# Patient Record
Sex: Male | Born: 1965 | Race: White | Hispanic: No | Marital: Single | Smoking: Former smoker
Health system: Southern US, Community
[De-identification: ages and names within clinical notes are randomized; demographics above are authoritative.]

## PROBLEM LIST (undated history)

## (undated) DIAGNOSIS — J189 Pneumonia, unspecified organism: Secondary | ICD-10-CM

---
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
## 2002-03-30 ENCOUNTER — Emergency Department (HOSPITAL_COMMUNITY): Admission: EM | Admit: 2002-03-30 | Discharge: 2002-03-30 | Payer: Self-pay | Admitting: Emergency Medicine

## 2003-04-25 ENCOUNTER — Emergency Department (HOSPITAL_COMMUNITY): Admission: EM | Admit: 2003-04-25 | Discharge: 2003-04-25 | Payer: Self-pay | Admitting: Emergency Medicine

## 2004-02-12 ENCOUNTER — Emergency Department (HOSPITAL_COMMUNITY): Admission: EM | Admit: 2004-02-12 | Discharge: 2004-02-12 | Payer: Self-pay | Admitting: Emergency Medicine

## 2004-05-06 ENCOUNTER — Emergency Department (HOSPITAL_COMMUNITY): Admission: EM | Admit: 2004-05-06 | Discharge: 2004-05-06 | Payer: Self-pay | Admitting: Emergency Medicine

## 2004-07-24 ENCOUNTER — Emergency Department (HOSPITAL_COMMUNITY): Admission: EM | Admit: 2004-07-24 | Discharge: 2004-07-24 | Payer: Self-pay | Admitting: Emergency Medicine

## 2004-11-23 ENCOUNTER — Emergency Department (HOSPITAL_COMMUNITY): Admission: EM | Admit: 2004-11-23 | Discharge: 2004-11-23 | Payer: Self-pay | Admitting: *Deleted

## 2004-12-14 ENCOUNTER — Emergency Department (HOSPITAL_COMMUNITY): Admission: EM | Admit: 2004-12-14 | Discharge: 2004-12-14 | Payer: Self-pay | Admitting: *Deleted

## 2010-08-20 ENCOUNTER — Emergency Department (HOSPITAL_BASED_OUTPATIENT_CLINIC_OR_DEPARTMENT_OTHER)
Admission: EM | Admit: 2010-08-20 | Discharge: 2010-08-20 | Disposition: A | Discharge status: Home / Self Care | Payer: Self-pay | Attending: Emergency Medicine | Admitting: Emergency Medicine

## 2010-08-20 DIAGNOSIS — IMO0002 Reserved for concepts with insufficient information to code with codable children: Secondary | ICD-10-CM | POA: Insufficient documentation

## 2010-08-20 DIAGNOSIS — F17200 Nicotine dependence, unspecified, uncomplicated: Secondary | ICD-10-CM | POA: Insufficient documentation

## 2011-01-19 ENCOUNTER — Encounter: Payer: Self-pay | Admitting: *Deleted

## 2011-01-19 ENCOUNTER — Emergency Department (HOSPITAL_BASED_OUTPATIENT_CLINIC_OR_DEPARTMENT_OTHER)
Admission: EM | Admit: 2011-01-19 | Discharge: 2011-01-19 | Disposition: A | Discharge status: Home / Self Care | Payer: Self-pay | Attending: Emergency Medicine | Admitting: Emergency Medicine

## 2011-01-19 ENCOUNTER — Emergency Department (INDEPENDENT_AMBULATORY_CARE_PROVIDER_SITE_OTHER): Payer: Self-pay

## 2011-01-19 DIAGNOSIS — J329 Chronic sinusitis, unspecified: Secondary | ICD-10-CM | POA: Insufficient documentation

## 2011-01-19 DIAGNOSIS — R51 Headache: Secondary | ICD-10-CM

## 2011-01-19 DIAGNOSIS — F17200 Nicotine dependence, unspecified, uncomplicated: Secondary | ICD-10-CM | POA: Insufficient documentation

## 2011-01-19 DIAGNOSIS — J320 Chronic maxillary sinusitis: Secondary | ICD-10-CM

## 2011-01-19 MED ORDER — AMOXICILLIN 500 MG PO CAPS: 500.0000 mg | ORAL_CAPSULE | Freq: Three times a day (TID) | ORAL | Status: AC

## 2011-01-19 NOTE — ED Provider Notes (Signed)
History     CSN: [id number] Arrival date & time: 01/19/2011  5:54 PM  Chief Complaint  Patient presents with  . Headache    (Consider location/radiation/quality/duration/timing/severity/associated sxs/prior treatment) HPI Comments: Pt states that he has a cold over the last couple of weeks and the symptoms have gotten worse over the last month:pt states that when he has the symptoms if he is driving he has to pull over the side of the road:pt states that the symptoms resolve on there own after about 1 hour:pt states that he doesn't have blurred vision, but will get diaphoretic when the episodes happen  Patient is a 45 y.o. male presenting with headaches. The history is provided by the patient. No language interpreter was used.  Headache  This is a recurrent problem. The current episode started more than 1 week ago. Episode frequency: at least once a day over that last 5 months. The problem has not changed since onset.The pain is located in the right unilateral region. The quality of the pain is described as throbbing. The pain is severe. The pain does not radiate. Pertinent negatives include no fever, no malaise/fatigue, no palpitations and no shortness of breath. He has tried aspirin for the symptoms. The treatment provided mild relief.    History reviewed. No pertinent past medical history.  History reviewed. No pertinent past surgical history.  No family history on file.  History  Substance Use Topics  . Smoking status: Current Everyday Smoker -- 1.0 packs/day  . Smokeless tobacco: Not on file  . Alcohol Use: No      Review of Systems  Constitutional: Negative for fever and malaise/fatigue.  Respiratory: Negative for shortness of breath.   Cardiovascular: Negative for palpitations.  Neurological: Positive for headaches.  All other systems reviewed and are negative.    Allergies  Review of patient's allergies indicates no known allergies.  Home Medications  No current  outpatient prescriptions on file.  BP 132/75  Pulse 82  Temp(Src) 97.7 F (36.5 C) (Oral)  Resp 20  SpO2 100%  Physical Exam  Nursing note and vitals reviewed. Constitutional: He is oriented to person, place, and time. He appears well-developed and well-nourished.  HENT:  Head: Normocephalic and atraumatic.  Right Ear: External ear normal.  Left Ear: External ear normal.  Eyes: Pupils are equal, round, and reactive to light.  Neck: Neck supple.  Cardiovascular: Normal rate and regular rhythm.   Pulmonary/Chest: Effort normal and breath sounds normal.  Musculoskeletal: Normal range of motion.  Neurological: He is alert and oriented to person, place, and time.  Skin: Skin is warm and dry.    ED Course  Procedures (including critical care time)  Labs Reviewed - No data to display Ct Head Wo Contrast  01/19/2011  *RADIOLOGY REPORT*  Clinical Data: Headache, pain over right eye  CT HEAD WITHOUT CONTRAST  Technique:  Contiguous axial images were obtained from the base of the skull through the vertex without contrast.  Comparison: None.  Findings: No acute intracranial hemorrhage.  No focal mass lesion. No CT evidence of acute infarction.  No midline shift or mass effect and no hydrocephalus.  Basilar cisterns patent.  Mastoid air cells are clear.  Mild mucosal thickening within the maxillary sinuses.  IMPRESSION:  1.  No acute intracranial findings. 2.  Mild maxillary sinusitis.  Original Report Authenticated By: Genevive Bi, M.D.     No diagnosis found.    MDM  Will treat pt for sinusitis  Teressa Lower, NP 01/19/11 [phone]

## 2011-01-19 NOTE — ED Notes (Signed)
Cough sore throat and headache. States he thinks he has a sinus infection.

## 2011-01-19 NOTE — ED Notes (Signed)
Pt. Reports his pain a 7/10  No distress noted in pt.   Able to walk steady gait and no dizziness per Pt.

## 2011-01-22 NOTE — ED Provider Notes (Signed)
Medical screening examination/treatment/procedure(s) were performed by non-physician practitioner and as supervising physician I was immediately available for consultation/collaboration.  Cyndra Numbers, MD 01/22/11 [phone]

## 2011-06-13 ENCOUNTER — Emergency Department (HOSPITAL_BASED_OUTPATIENT_CLINIC_OR_DEPARTMENT_OTHER): Payer: Self-pay

## 2011-06-13 ENCOUNTER — Other Ambulatory Visit: Payer: Self-pay

## 2011-06-13 ENCOUNTER — Inpatient Hospital Stay (HOSPITAL_BASED_OUTPATIENT_CLINIC_OR_DEPARTMENT_OTHER)
Admission: EM | Admit: 2011-06-13 | Discharge: 2011-06-17 | DRG: 871 | Disposition: A | Discharge status: Home / Self Care | Payer: Self-pay | Source: Ambulatory Visit | Attending: Internal Medicine | Admitting: Internal Medicine

## 2011-06-13 ENCOUNTER — Emergency Department (INDEPENDENT_AMBULATORY_CARE_PROVIDER_SITE_OTHER): Payer: Self-pay

## 2011-06-13 ENCOUNTER — Encounter (HOSPITAL_BASED_OUTPATIENT_CLINIC_OR_DEPARTMENT_OTHER): Payer: Self-pay | Admitting: *Deleted

## 2011-06-13 DIAGNOSIS — Z681 Body mass index (BMI) 19 or less, adult: Secondary | ICD-10-CM

## 2011-06-13 DIAGNOSIS — N170 Acute kidney failure with tubular necrosis: Secondary | ICD-10-CM | POA: Diagnosis not present

## 2011-06-13 DIAGNOSIS — R071 Chest pain on breathing: Secondary | ICD-10-CM | POA: Diagnosis present

## 2011-06-13 DIAGNOSIS — E8779 Other fluid overload: Secondary | ICD-10-CM | POA: Diagnosis present

## 2011-06-13 DIAGNOSIS — R079 Chest pain, unspecified: Secondary | ICD-10-CM

## 2011-06-13 DIAGNOSIS — F17200 Nicotine dependence, unspecified, uncomplicated: Secondary | ICD-10-CM | POA: Diagnosis present

## 2011-06-13 DIAGNOSIS — R0602 Shortness of breath: Secondary | ICD-10-CM

## 2011-06-13 DIAGNOSIS — R000 Tachycardia, unspecified: Secondary | ICD-10-CM | POA: Diagnosis present

## 2011-06-13 DIAGNOSIS — D72829 Elevated white blood cell count, unspecified: Secondary | ICD-10-CM | POA: Diagnosis present

## 2011-06-13 DIAGNOSIS — Z87892 Personal history of anaphylaxis: Secondary | ICD-10-CM

## 2011-06-13 DIAGNOSIS — A419 Sepsis, unspecified organism: Principal | ICD-10-CM | POA: Diagnosis present

## 2011-06-13 DIAGNOSIS — Z91038 Other insect allergy status: Secondary | ICD-10-CM

## 2011-06-13 DIAGNOSIS — Z79899 Other long term (current) drug therapy: Secondary | ICD-10-CM

## 2011-06-13 DIAGNOSIS — R918 Other nonspecific abnormal finding of lung field: Secondary | ICD-10-CM

## 2011-06-13 DIAGNOSIS — N289 Disorder of kidney and ureter, unspecified: Secondary | ICD-10-CM | POA: Diagnosis present

## 2011-06-13 DIAGNOSIS — R799 Abnormal finding of blood chemistry, unspecified: Secondary | ICD-10-CM

## 2011-06-13 DIAGNOSIS — I959 Hypotension, unspecified: Secondary | ICD-10-CM | POA: Diagnosis present

## 2011-06-13 DIAGNOSIS — T50995A Adverse effect of other drugs, medicaments and biological substances, initial encounter: Secondary | ICD-10-CM | POA: Diagnosis not present

## 2011-06-13 DIAGNOSIS — J189 Pneumonia, unspecified organism: Secondary | ICD-10-CM | POA: Diagnosis present

## 2011-06-13 DIAGNOSIS — J90 Pleural effusion, not elsewhere classified: Secondary | ICD-10-CM | POA: Diagnosis present

## 2011-06-13 DIAGNOSIS — E871 Hypo-osmolality and hyponatremia: Secondary | ICD-10-CM | POA: Diagnosis present

## 2011-06-13 DIAGNOSIS — E876 Hypokalemia: Secondary | ICD-10-CM | POA: Diagnosis present

## 2011-06-13 DIAGNOSIS — Z23 Encounter for immunization: Secondary | ICD-10-CM

## 2011-06-13 LAB — CBC
HCT: 38.9 % — ABNORMAL LOW (ref 39.0–52.0)
HCT: 34.8 % — ABNORMAL LOW (ref 39.0–52.0)
MCH: 32.3 pg (ref 26.0–34.0)
MCV: 89 fL (ref 78.0–100.0)
MCV: 89.9 fL (ref 78.0–100.0)
Platelets: 185 10*3/uL (ref 150–400)
RBC: 4.37 MIL/uL (ref 4.22–5.81)
RDW: 14 % (ref 11.5–15.5)
RDW: 14.1 % (ref 11.5–15.5)
WBC: 27.5 10*3/uL — ABNORMAL HIGH (ref 4.0–10.5)

## 2011-06-13 LAB — BASIC METABOLIC PANEL
BUN: 21 mg/dL (ref 6–23)
Chloride: 96 mEq/L (ref 96–112)
GFR calc Af Amer: 83 mL/min — ABNORMAL LOW (ref >90)
GFR calc non Af Amer: 72 mL/min — ABNORMAL LOW (ref >90)
Glucose, Bld: 144 mg/dL — ABNORMAL HIGH (ref 70–99)
Potassium: 4.3 mEq/L (ref 3.5–5.1)
Sodium: 132 mEq/L — ABNORMAL LOW (ref 135–145)

## 2011-06-13 LAB — CARDIAC PANEL(CRET KIN+CKTOT+MB+TROPI)
CK, MB: 1.4 ng/mL (ref 0.3–4.0)
Relative Index: INVALID (ref 0.0–2.5)
Troponin I: <0.3 ng/mL (ref <0.30)

## 2011-06-13 MED ORDER — SODIUM CHLORIDE 0.9 % IV SOLN
INTRAVENOUS | Status: DC
  Administered 2011-06-13: 125 mL via INTRAVENOUS
  Administered 2011-06-14 – 2011-06-15 (×2): via INTRAVENOUS

## 2011-06-13 MED ORDER — IPRATROPIUM BROMIDE 0.02 % IN SOLN
0.5000 mg | Freq: Four times a day (QID) | RESPIRATORY_TRACT | Status: DC
  Administered 2011-06-13 – 2011-06-17 (×12): 0.5 mg via RESPIRATORY_TRACT
  Filled 2011-06-13 (×13): qty 2.5

## 2011-06-13 MED ORDER — DEXTROSE 5 % IV SOLN
1.0000 g | INTRAVENOUS | Status: DC
  Administered 2011-06-14 – 2011-06-16 (×3): 1 g via INTRAVENOUS
  Filled 2011-06-13 (×5): qty 10

## 2011-06-13 MED ORDER — PNEUMOCOCCAL VAC POLYVALENT 25 MCG/0.5ML IJ INJ
0.5000 mL | INJECTION | INTRAMUSCULAR | Status: AC
  Filled 2011-06-13: qty 0.5

## 2011-06-13 MED ORDER — AZITHROMYCIN 250 MG PO TABS
500.0000 mg | ORAL_TABLET | Freq: Once | ORAL | Status: AC
  Administered 2011-06-13: 500 mg via ORAL
  Filled 2011-06-13: qty 2

## 2011-06-13 MED ORDER — IBUPROFEN 400 MG PO TABS
400.0000 mg | ORAL_TABLET | Freq: Three times a day (TID) | ORAL | Status: DC
  Administered 2011-06-13 – 2011-06-14 (×2): 400 mg via ORAL
  Filled 2011-06-13 (×4): qty 1

## 2011-06-13 MED ORDER — ONDANSETRON HCL 4 MG/2ML IJ SOLN
4.0000 mg | Freq: Once | INTRAMUSCULAR | Status: AC
  Administered 2011-06-13: 4 mg via INTRAVENOUS
  Filled 2011-06-13: qty 2

## 2011-06-13 MED ORDER — MORPHINE SULFATE 4 MG/ML IJ SOLN
4.0000 mg | Freq: Once | INTRAMUSCULAR | Status: AC
  Administered 2011-06-13: 4 mg via INTRAVENOUS
  Filled 2011-06-13: qty 1

## 2011-06-13 MED ORDER — SODIUM CHLORIDE 0.9 % IV BOLUS (SEPSIS)
1000.0000 mL | Freq: Once | INTRAVENOUS | Status: AC
  Administered 2011-06-13: 1000 mL via INTRAVENOUS

## 2011-06-13 MED ORDER — METOPROLOL TARTRATE 50 MG PO TABS
50.0000 mg | ORAL_TABLET | Freq: Two times a day (BID) | ORAL | Status: DC
  Administered 2011-06-13: 50 mg via ORAL
  Filled 2011-06-13 (×3): qty 1

## 2011-06-13 MED ORDER — DEXTROSE 5 % IV SOLN
500.0000 mg | INTRAVENOUS | Status: DC
  Administered 2011-06-14 – 2011-06-16 (×3): 500 mg via INTRAVENOUS
  Filled 2011-06-13 (×5): qty 500

## 2011-06-13 MED ORDER — INFLUENZA VIRUS VACC SPLIT PF IM SUSP
0.5000 mL | INTRAMUSCULAR | Status: DC
  Filled 2011-06-13: qty 0.5

## 2011-06-13 MED ORDER — MORPHINE SULFATE 4 MG/ML IJ SOLN
4.0000 mg | INTRAMUSCULAR | Status: DC | PRN
  Administered 2011-06-13: 4 mg via INTRAVENOUS
  Filled 2011-06-13: qty 1

## 2011-06-13 MED ORDER — DEXTROSE 5 % IV SOLN
1.0000 g | Freq: Once | INTRAVENOUS | Status: AC
  Administered 2011-06-13: 1 g via INTRAVENOUS
  Filled 2011-06-13: qty 10

## 2011-06-13 MED ORDER — HYDROMORPHONE HCL PF 1 MG/ML IJ SOLN
1.0000 mg | Freq: Once | INTRAMUSCULAR | Status: AC
  Administered 2011-06-13: 1 mg via INTRAVENOUS
  Filled 2011-06-13: qty 1

## 2011-06-13 MED ORDER — ALBUTEROL SULFATE (5 MG/ML) 0.5% IN NEBU
2.5000 mg | INHALATION_SOLUTION | Freq: Four times a day (QID) | RESPIRATORY_TRACT | Status: DC
  Administered 2011-06-13 – 2011-06-17 (×12): 2.5 mg via RESPIRATORY_TRACT
  Filled 2011-06-13 (×12): qty 0.5

## 2011-06-13 MED ORDER — IOHEXOL 350 MG/ML SOLN
100.0000 mL | Freq: Once | INTRAVENOUS | Status: AC | PRN
  Administered 2011-06-13: 100 mL via INTRAVENOUS

## 2011-06-13 MED ORDER — PANTOPRAZOLE SODIUM 40 MG IV SOLR
40.0000 mg | INTRAVENOUS | Status: DC
  Administered 2011-06-13: 40 mg via INTRAVENOUS
  Filled 2011-06-13 (×2): qty 40

## 2011-06-13 MED ORDER — ACETAMINOPHEN 325 MG PO TABS
650.0000 mg | ORAL_TABLET | Freq: Four times a day (QID) | ORAL | Status: DC | PRN
  Administered 2011-06-13 – 2011-06-16 (×3): 650 mg via ORAL
  Filled 2011-06-13 (×4): qty 2

## 2011-06-13 NOTE — ED Notes (Addendum)
Patient states that it hurts when he takes a breath and he has a hard time catching his breath, feels like someone is stabbing him when he inhales, states he was out Friday night and smoked and consumed alcohol more than usual

## 2011-06-13 NOTE — H&P (Signed)
PCP:  No primary provider on file. Chief Complaint:  Cough, shortness of breath, and fever of 3 days' duration  HPI:  Patient is a 46 year old Caucasian male with history of chronic tobacco use but no known medical history presenting to the hospital with 3 days history of cough that is said to be productive of yellowish sputum. This is said to be associated with pleuritic chest pain mainly in the left hemithorax. Patient also complained about shortness of breath. He denied any history of palpitation. No PND or orthopnea. Associated with the cough and shortness of breath is fever which is said to be intermittent. Denies any history of chills or Rigors. Patient  is said to be nauseated but denied any vomiting. He denies any history of abdominal discomfort. No diarrhea or hematochezia. No dysuria or hematuria.        Symptoms was said to have persisted pains patient presented to the hospital to be evaluated.  Review of Systems:  The patient denies anorexia, fever++, weight loss,, vision loss, decreased hearing, hoarseness, chest pain++, syncope, shortness of breath++, dyspnea on exertion, peripheral edema, balance deficits, hemoptysis, cough productive of yellowish sputum++, abdominal pain, melena, hematochezia, severe indigestion/heartburn, hematuria, incontinence, genital sores, muscle weakness, suspicious skin lesions, transient blindness, difficulty walking, depression, unusual weight change, abnormal bleeding, enlarged lymph nodes, angioedema, and breast masses.  Past Medical History:  History reviewed. No pertinent past medical history.  Past Surgical History  Procedure Date  . Steel pin put in rt hand 1985    Medications:  Prior to Admission medications   Not on File    Allergies:  Allergies  Allergen Reactions  . Bee Anaphylaxis    Social History:   reports that he has been smoking.  He does not have any smokeless tobacco history on file. He reports that he drinks  alcohol. He reports that he uses illicit drugs (Marijuana).  Family History:  History reviewed. No pertinent family history.  Physical Exam:  Filed Vitals:   06/13/11 1458 06/13/11 1620 06/13/11 1630 06/13/11 1800  BP: 117/76  123/72 115/70  Pulse: 125  125 125  Temp:  99.1 F (37.3 C)  102.6 F (39.2 C)  TempSrc:  Oral Oral   Resp: 26  28 27   Height:   6' (1.829 m)   Weight:   60.6 kg (133 lb 9.6 oz)   SpO2: 95%  94% 98%      General: Alert and oriented times three, not in acute distress, dehydrated and febrile  Eyes: PERRLA, pink conjunctiva, scleral anicterus  ENT: Dry oral mucosa, neck supple, no thyromegaly  Lungs: Decreased air entry in the upper lobes of the lungs, worse on the left upper lobe than the right with minimally scattered rhonchi.  Cardiovascular: Tachycardic, S1 and S2, no murmurs  Abdomen: soft, nontender, no organomegaly, bowel sounds are positive.  GU: not examined  Neuro: No lateralizing signs  Musculoskeletal: Normal with no pedal edema  Skin: Markedly decreased turgor  Psych: appropriate affect  ?  Labs on Admission:   [facility] 06/13/11 1015  NA 132*  K 4.3  CL 96  CO2 23  GLUCOSE 144*  BUN 21  CREATININE 1.20  CALCIUM 9.1  MG --  PHOS --    No results found for this basename: AST:2,ALT:2,ALKPHOS:2,BILITOT:2,PROT:2,ALBUMIN:2 in the last 72 hours  No results found for this basename: LIPASE:2,AMYLASE:2 in the last 72 hours   Basename 06/13/11 1015  WBC 27.5*  NEUTROABS --  HGB 14.1  HCT 38.9*  MCV 89.0  PLT 209    No results found for this basename: CKTOTAL:3,CKMB:3,CKMBINDEX:3,TROPONINI:3 in the last 72 hours  No results found for this basename: TSH,T4TOTAL,FREET3,T3FREE,THYROIDAB in the last 72 hours  No results found for this basename: VITAMINB12:2,FOLATE:2,FERRITIN:2,TIBC:2,IRON:2,RETICCTPCT:2 in the last 72 hours  Radiological Exams on Admission:  Dg Chest 2 View  06/13/2011  *RADIOLOGY REPORT*   Clinical Data: Chest pain.  Short of breath.  CHEST - 2 VIEW  Comparison: None.  Findings: Consolidation is present in the posterior segment of the left upper lobe.  Lungs are hyperaerated.  Bronchitic changes are noted. Mild patchy airspace disease in the inferior and lateral right upper lobe.  No pneumothorax.  The heart size.  IMPRESSION: Mild right upper lobe airspace disease.  Consolidation in the posterior segment of the left upper lobe.  Follow-up studies are recommended after appropriate antibiotic therapy.  If the abnormalities fail to resolve, CT should be performed to rule out underlying mass.  Original Report Authenticated By: Donavan Burnet, M.D.   Ct Angio Chest W/cm &/or Wo Cm  06/13/2011  *RADIOLOGY REPORT*  Clinical Data: 46 year old male with chest pain, shortness of breath and elevated D-dimer.  CT ANGIOGRAPHY CHEST  Technique:  Multidetector CT imaging of the chest using the standard protocol during bolus administration of intravenous contrast. Multiplanar reconstructed images including MIPs were obtained and reviewed to evaluate the vascular anatomy.  Contrast: OMNIPAQUE IOHEXOL 350 MG/ML IV SOLN  Comparison: 06/13/2011 radiograph  Findings: This is a technically satisfactory study.  No pulmonary emboli are identified. There is no evidence of thoracic aortic aneurysm or dissection. The heart and great vessels are within normal limits.  A trace left pleural effusion is noted. There is no evidence of right pleural effusion or pericardial effusion. No enlarged lymph nodes are identified.  Consolidation within the posterior left upper lobe and lingula is compatible with pneumonia. Minimal tree in bud opacities within the right lower lobe and right middle lobe (image 81) are compatible with minimal infection.  No suspicious nodules, masses or endobronchial/endotracheal lesions identified.  No acute or suspicious bony abnormalities are identified.  IMPRESSION: Left upper lobe/lingular  consolidation compatible with pneumonia.  Minimal infection within the right middle lobe/right lower lobe.  No evidence of pulmonary emboli or thoracic aortic aneurysm/dissection.  No evidence of suspicious pulmonary nodule or mass.  Original Report Authenticated By: Rosendo Gros, M.D.    Assessment/Plan  Present on Admission:   Problems: #1 cough-productive of yellowish sputum #2 pleuritic chest pain #3 shortness of breath #4 tachycardia #5 dehydration #6 decrease air entry in the upper lobe with minimally scattered rhonchi #7 fever #8 electrolyte imbalance  Impression: #1 left upper lobe pneumonia #2 questionable COPD #3 history of chronic tobacco use #4 dehydration #5 tachycardia #6 electrolyte imbalance.  Plan: #1 admit patient to telemetry #2 treat for pneumonia with IV Rocephin and Zithromax #3 IV hydration with normal saline to correct electrolyte imbalance and to rehydrate #4 treat fever with ibuprofen #5 tachycardia which Lopressor #6 give nebulizer treatment i.e. albuterol and Atrovent for questionable COPD #7 oxygen support with O2 via nasal canulla 2-3liters/min. #8 labs; cardiac enzyme every 8x3, blood culture x2, CBC CMP and magnesium repeated in a.m.          Patient will be evaluated on daily basis                Talmage Nap  [phone]  

## 2011-06-13 NOTE — ED Provider Notes (Signed)
History     CSN: [id number]  Arrival date & time 06/13/11  9629   First MD Initiated Contact with Patient 06/13/11 0940      Chief Complaint  Patient presents with  . Shortness of Breath    (Consider location/radiation/quality/duration/timing/severity/associated sxs/prior treatment) HPI Patient presents with complaint of shortness of breath and pain in his left chest wall and side with breathing. Patient is a 1 pack-a-day smoker. He denies any fevers or chills. His cough has been nonproductive and has not significantly increased over his baseline cough but is associated now with pain with coughing. He's had no vomiting. He's had no leg swelling, no history of DVT or PE, no recent surgeries or trauma, no recent prolonged travel.  There are no other associated systemic symptoms.  Breathing and coughing make pain worse.   History reviewed. No pertinent past medical history.  History reviewed. No pertinent past surgical history.  History reviewed. No pertinent family history.  History  Substance Use Topics  . Smoking status: Current Everyday Smoker -- 1.0 packs/day  . Smokeless tobacco: Not on file  . Alcohol Use: Yes      Review of Systems ROS reviewed and otherwise negative except for mentioned in HPI  Allergies  Bee  Home Medications   Current Outpatient Rx  Name Route Sig Dispense Refill  . GOODY HEADACHE PO Oral Take 2 packets by mouth every 4 (four) hours as needed. For pain       BP 109/72  Pulse 118  Temp(Src) 98.3 F (36.8 C) (Oral)  Resp 27  Ht 6' (1.829 m)  Wt 145 lb (65.772 kg)  BMI 19.67 kg/m2  SpO2 96% Vitals reviewed Physical Exam Physical Examination: General appearance - alert, uncomfortable appearing, and in no distress Mental status - alert, oriented to person, place, and time Mouth - mucous membranes moist, pharynx normal without lesions Chest - decreased air movement bilaterally, mild tachypnea, no wheezes, rales or rhonchi, symmetric air  entry Heart - tachycardic, regular rhythm, normal S1, S2, no murmurs, rubs, clicks or gallops Abdomen - soft, nontender, nondistended, no masses or organomegaly Musculoskeletal - no joint tenderness, deformity or swelling Extremities - peripheral pulses normal, no pedal edema, no clubbing or cyanosis Skin - normal coloration and turgor, no rashes  ED Course  Procedures (including critical care time)  Date: 06/13/2011  Rate: 109  Rhythm: sinus tachycardia  QRS Axis: normal  Intervals: normal  ST/T Wave abnormalities: normal  Conduction Disutrbances:none  Narrative Interpretation:   Old EKG Reviewed: none available  2:13 PM pt reassessed, he continues to have mild tachycardia and tachypnea.  O2 sat is 100% on [place] O2, BP has remained stable.  D/w [facility] hospitalist for admission, Dr. Donna Bernard, at [facility], she requests him to be in Stepdown bed.  Pt is agreeable with the plan for admission/transfer.     Labs Reviewed  CBC - Abnormal; Notable for the following:    WBC 27.5 (*)    HCT 38.9 (*)    MCHC 36.2 (*)    All other components within normal limits  D-DIMER, QUANTITATIVE - Abnormal; Notable for the following:    D-Dimer, Quant 0.73 (*)    All other components within normal limits  BASIC METABOLIC PANEL - Abnormal; Notable for the following:    Sodium 132 (*)    Glucose, Bld 144 (*)    GFR calc non Af Amer 72 (*)    GFR calc Af Amer 83 (*)    All other  components within normal limits   Dg Chest 2 View  06/13/2011  *RADIOLOGY REPORT*  Clinical Data: Chest pain.  Short of breath.  CHEST - 2 VIEW  Comparison: None.  Findings: Consolidation is present in the posterior segment of the left upper lobe.  Lungs are hyperaerated.  Bronchitic changes are noted. Mild patchy airspace disease in the inferior and lateral right upper lobe.  No pneumothorax.  The heart size.  IMPRESSION: Mild right upper lobe airspace disease.  Consolidation in the posterior segment of the left upper lobe.   Follow-up studies are recommended after appropriate antibiotic therapy.  If the abnormalities fail to resolve, CT should be performed to rule out underlying mass.  Original Report Authenticated By: Donavan Burnet, M.D.   Ct Angio Chest W/cm &/or Wo Cm  06/13/2011  *RADIOLOGY REPORT*  Clinical Data: 46 year old male with chest pain, shortness of breath and elevated D-dimer.  CT ANGIOGRAPHY CHEST  Technique:  Multidetector CT imaging of the chest using the standard protocol during bolus administration of intravenous contrast. Multiplanar reconstructed images including MIPs were obtained and reviewed to evaluate the vascular anatomy.  Contrast: OMNIPAQUE IOHEXOL 350 MG/ML IV SOLN  Comparison: 06/13/2011 radiograph  Findings: This is a technically satisfactory study.  No pulmonary emboli are identified. There is no evidence of thoracic aortic aneurysm or dissection. The heart and great vessels are within normal limits.  A trace left pleural effusion is noted. There is no evidence of right pleural effusion or pericardial effusion. No enlarged lymph nodes are identified.  Consolidation within the posterior left upper lobe and lingula is compatible with pneumonia. Minimal tree in bud opacities within the right lower lobe and right middle lobe (image 68) are compatible with minimal infection.  No suspicious nodules, masses or endobronchial/endotracheal lesions identified.  No acute or suspicious bony abnormalities are identified.  IMPRESSION: Left upper lobe/lingular consolidation compatible with pneumonia.  Minimal infection within the right middle lobe/right lower lobe.  No evidence of pulmonary emboli or thoracic aortic aneurysm/dissection.  No evidence of suspicious pulmonary nodule or mass.  Original Report Authenticated By: Rosendo Gros, M.D.     1. Pneumonia   2. Tachycardia   3. Leukocytosis       MDM  Patient presenting with shortness of breath and pleuritic chest pain. Initial evaluation  reveals patient has mild tachycardia and tachypnea. Workup reveals bilateral pneumonia. He also has a leukocytosis of approximately 28,000. A CT angiogram chest was obtained which showed no evidence of pulmonary embolism but did confirm infiltrates consistent with pneumonia. Patient was started on IV Rocephin and given by mouth Zithromax. He has remained stable throughout his ED course but does continue to have mild tachypnea and tachycardia. For this reason I felt he needed to be admitted for further evaluation and treatment of his pneumonia.        Ethelda Chick, MD 06/13/11 [phone]

## 2011-06-14 ENCOUNTER — Inpatient Hospital Stay (HOSPITAL_COMMUNITY): Payer: Self-pay

## 2011-06-14 ENCOUNTER — Encounter (HOSPITAL_COMMUNITY): Payer: Self-pay | Admitting: Pulmonary Disease

## 2011-06-14 DIAGNOSIS — J9600 Acute respiratory failure, unspecified whether with hypoxia or hypercapnia: Secondary | ICD-10-CM

## 2011-06-14 DIAGNOSIS — J13 Pneumonia due to Streptococcus pneumoniae: Secondary | ICD-10-CM

## 2011-06-14 DIAGNOSIS — I959 Hypotension, unspecified: Secondary | ICD-10-CM

## 2011-06-14 LAB — COMPREHENSIVE METABOLIC PANEL
ALT: 14 U/L (ref 0–53)
AST: 27 U/L (ref 0–37)
Albumin: 2.3 g/dL — ABNORMAL LOW (ref 3.5–5.2)
CO2: 20 mEq/L (ref 19–32)
Calcium: 8 mg/dL — ABNORMAL LOW (ref 8.4–10.5)
Sodium: 130 mEq/L — ABNORMAL LOW (ref 135–145)
Total Protein: 5.7 g/dL — ABNORMAL LOW (ref 6.0–8.3)

## 2011-06-14 LAB — CARDIAC PANEL(CRET KIN+CKTOT+MB+TROPI)
CK, MB: 1.8 ng/mL (ref 0.3–4.0)
Relative Index: INVALID (ref 0.0–2.5)
Troponin I: <0.3 ng/mL (ref <0.30)

## 2011-06-14 LAB — CBC
MCH: 32.2 pg (ref 26.0–34.0)
MCHC: 35.1 g/dL (ref 30.0–36.0)
Platelets: 155 10*3/uL (ref 150–400)
RBC: 3.54 MIL/uL — ABNORMAL LOW (ref 4.22–5.81)
RDW: 14.2 % (ref 11.5–15.5)

## 2011-06-14 LAB — INFLUENZA PANEL BY PCR (TYPE A & B)
Influenza A By PCR: NEGATIVE
Influenza B By PCR: NEGATIVE

## 2011-06-14 LAB — DIFFERENTIAL
Basophils Absolute: 0 10*3/uL (ref 0.0–0.1)
Eosinophils Relative: 0 % (ref 0–5)
Lymphs Abs: 1.1 10*3/uL (ref 0.7–4.0)
Monocytes Absolute: 0.5 10*3/uL (ref 0.1–1.0)
Neutro Abs: 16 10*3/uL — ABNORMAL HIGH (ref 1.7–7.7)
WBC Morphology: INCREASED

## 2011-06-14 LAB — PROCALCITONIN: Procalcitonin: 43.04 ng/mL

## 2011-06-14 MED ORDER — SODIUM CHLORIDE 0.9 % IV BOLUS (SEPSIS)
500.0000 mL | Freq: Once | INTRAVENOUS | Status: AC
  Administered 2011-06-14: 500 mL via INTRAVENOUS

## 2011-06-14 MED ORDER — PROMETHAZINE HCL 25 MG/ML IJ SOLN
12.5000 mg | INTRAMUSCULAR | Status: DC | PRN
  Administered 2011-06-14 – 2011-06-16 (×6): 12.5 mg via INTRAVENOUS
  Filled 2011-06-14 (×6): qty 1

## 2011-06-14 MED ORDER — PANTOPRAZOLE SODIUM 40 MG PO TBEC: 40.0000 mg | DELAYED_RELEASE_TABLET | Freq: Every day | ORAL | Status: DC

## 2011-06-14 MED ORDER — ONDANSETRON HCL 4 MG/2ML IJ SOLN
4.0000 mg | Freq: Four times a day (QID) | INTRAMUSCULAR | Status: DC | PRN
  Administered 2011-06-14 – 2011-06-16 (×4): 4 mg via INTRAVENOUS
  Filled 2011-06-14 (×4): qty 2

## 2011-06-14 MED ORDER — ONDANSETRON HCL 4 MG/2ML IJ SOLN
INTRAMUSCULAR | Status: AC
  Administered 2011-06-14: 06:00:00
  Filled 2011-06-14: qty 2

## 2011-06-14 MED ORDER — HYDROCODONE-ACETAMINOPHEN 5-325 MG PO TABS
1.0000 | ORAL_TABLET | ORAL | Status: DC | PRN
  Administered 2011-06-14 – 2011-06-16 (×4): 1 via ORAL
  Filled 2011-06-14 (×4): qty 1

## 2011-06-14 MED ORDER — SODIUM CHLORIDE 0.9 % IV BOLUS (SEPSIS)
2000.0000 mL | Freq: Once | INTRAVENOUS | Status: AC
  Administered 2011-06-14: 2000 mL via INTRAVENOUS

## 2011-06-14 MED ORDER — SODIUM CHLORIDE 0.9 % IV BOLUS (SEPSIS)
1000.0000 mL | Freq: Once | INTRAVENOUS | Status: AC
  Administered 2011-06-14: 1000 mL via INTRAVENOUS

## 2011-06-14 MED ORDER — PANTOPRAZOLE SODIUM 40 MG IV SOLR
40.0000 mg | Freq: Every day | INTRAVENOUS | Status: DC
  Administered 2011-06-14 – 2011-06-15 (×2): 40 mg via INTRAVENOUS
  Filled 2011-06-14 (×3): qty 40

## 2011-06-14 NOTE — Consult Note (Signed)
Name: Isaac Wilkins MRN: [id number] DOB: 1965-05-14    LOS: 1  [place] [facility] / Critical Care Note   History of Present Illness:  46 y/o WM with no PMH, current 1-2 ppk smoker admitted on 2/24 with a 3 day history of cough, left sided pleuritic chest pain, minimal sputum production, body aches/chills (did not check temp),nausea and decreased PO intake.  Day prior to admit, felt in usual state of health.  2/22 felt poorly but went out after work. Drank 2 beer & 2 mixed drinks -did not pass out, no vomiting.  Has had some diarrhea.  CTA of chest demonstrates Left sided PNA.  Noted to by hypotensive, with normal mental status on 2/25.  PCCM consulted for PNA/?sepsis.    Lines / Drains:   Cultures: 2/24 BCx2>>> 2/24 MRSA PCR>>>neg 2/25 FLU PCR>>>neg 2/25 PCT>>>43.04 2/25 Lactic acid>>>2.0  Antibiotics: Rocephin 2/25 >>> Azithromycin 2/24>>>  Tests / Events: 2/24 CTA CHEST>>>Left upper lobe/lingular consolidation compatible with pneumonia. Minimal infection within the right middle lobe/right lower lobe. No evidence of pulmonary emboli or thoracic aortic aneurysm/dissection. No evidence of suspicious pulmonary nodule or mass.     History reviewed. No pertinent past medical history.  Past Surgical History  Procedure Date  . Steel pin put in rt hand 1985   Home Medications --no medications prior to admit.   Allergies Allergies  Allergen Reactions  . Bee Anaphylaxis    Family History History reviewed. No pertinent family history.  Social History History   Social History  . Marital Status: Single    Spouse Name: N/A    Number of Children: N/A  . Years of Education: N/A   Social History Main Topics  . Smoking status: Current Everyday Smoker -- 1.0 packs/day for 20 years  . Smokeless tobacco: None   Comment: smokes 1-2 ppd for 20 years  . Alcohol Use: Yes  . Drug Use: Yes    Special: Marijuana  . Sexually Active: None   Other Topics Concern  . None   Social  History Narrative  . None     Review Of Systems:  Gen: Denies weight change, night sweats.  Indicates fevers, chills,  HEENT: Denies blurred vision, double vision, hearing loss, tinnitus, sinus congestion, rhinorrhea, sore throat, neck stiffness, dysphagia PULM: SEE HPI.  Denies hemoptysis. CV: Denies chest pain, edema, orthopnea, paroxysmal nocturnal dyspnea, palpitations GI: Denies abdominal pain, hematochezia, melena, constipation, change in bowel habits GU: Denies dysuria, hematuria, polyuria, oliguria, urethral discharge Endocrine: Denies hot or cold intolerance, polyuria, polyphagia or appetite change Derm: Denies rash, dry skin, scaling or peeling skin change Heme: Denies easy bruising, bleeding, bleeding gums Neuro: Denies headache, numbness, weakness, slurred speech, loss of memory or consciousness   Vital Signs: Filed Vitals:   06/14/11 0900 06/14/11 1000 06/14/11 1100 06/14/11 1200  BP: 89/58 80/54 84/67  92/56  Pulse: 82 85 87 89  Temp:      TempSrc:      Resp: 21 20 23 22   Height:      Weight:      SpO2: 100% 99% 99% 99%    I/O last 3 completed shifts: In: 1631.3 [P.O.:300; I.V.:1331.3] Out: 601 [Urine:600; Stool:1]  Physical Examination: General: thin adult male in NAD Neuro: AAOx4, speech clear, MAE CV: s1s2 rrr, no m/r/g PULM: resp's even/non-labored, left lung diminished, right clear GI: round/soft, bsx4 active Extremities: warm/dry, no edema   Labs    CBC  Lab 06/14/11 0255 06/13/11 1930 06/13/11 1015  HGB 11.4* 12.5*  14.1  HCT 32.5* 34.8* 38.9*  WBC 17.6* 21.7* 27.5*  PLT 155 185 209     BMET  Lab 06/14/11 0255 06/13/11 1015  NA 130* 132*  K 4.0 4.3  CL 99 96  CO2 20 23  GLUCOSE 117* 144*  BUN 21 21  CREATININE 1.37* 1.20  CALCIUM 8.0* 9.1  MG 1.7 --  PHOS -- --     Radiology: 2/25 CXR>>>Left infiltrate   Assessment and Plan:  Left Upper PNA Assessment: Hx of smoker.  3 day hx of pleuritic chest pain, cough, mild sputum  production, fever.   Left sided infiltrate noted on CTA & CXR.   Plan: -follow cultures (note they were drawn after abx given) -abx as above -smoking cessation -nebs not likely offering much here, no evidence of bronchospasm -d/c scheduled motrin given renal insufficiency -f/u cxr -check urine for strep and legionella (given job hx)   Sepsis Assessment: secondary to PNA with mild renal insufficiency.  Has had 2 L thus far.  Likely dry with decreased PO intake and ETOH on 2/22 evening.   Plan: -2 L fluid bolus now over 2 hours, then NS at 125 -follow up lactate, PCT at 4pm   Renal Insufficiency Assessment: secondary to sepsis and scheduled NSAID use Plan: -change NSAIDS to PRN -volume resuscitation    Best practices / Disposition: -->Code Status:  -->DVT Px: -->GI Px: -->Diet:    Canary Brim, NP-C [place] [facility] Pgr: [phone]  06/14/2011, 12:25 PM    Pt seen and examined and database reviewed. I agree with above findings, assessment and plan  Billy Fischer, MD;  PCCM service; Mobile [phone]

## 2011-06-14 NOTE — Progress Notes (Signed)
Subjective: States cough and pleuritic pain better this am, but still feels'rough' -  SOB, nauseous, body aches. Chart reviewed. Reports he was around his mon who was sick - unsure if she had the flu Objective: Vital signs in last 24 hours: Temp:  [97.2 F (36.2 C)-102.6 F (39.2 C)] 97.2 F (36.2 C) (02/25 0751) Pulse Rate:  [77-137] 83  (02/25 0645) Resp:  [17-28] 20  (02/25 0645) BP: (64-123)/(34-76) 81/58 mmHg (02/25 0545) SpO2:  [93 %-100 %] 98 % (02/25 0645) FiO2 (%):  [28 %] 28 % (02/25 0203) Weight:  [60.6 kg (133 lb 9.6 oz)-65.772 kg (145 lb)] 60.6 kg (133 lb 9.6 oz) (02/24 1630) Last BM Date: 06/13/11 Intake/Output from previous day: 02/24 0701 - 02/25 0700 In: 300 [P.O.:300] Out: 601 [Urine:600; Stool:1] Intake/Output this shift:      General Appearance:    Alert, cooperative, no distress, appears stated age  Lungs:     Bronchial BS in upper lobes, no crackles , no wheezes respirations unlabored   Heart:    Regular rate and rhythm, S1 and S2 normal, no murmur, rub   or gallop  Abdomen:     Soft, non-tender, bowel sounds active all four quadrants,    no masses, no organomegaly  Extremities:   Extremities normal, atraumatic, no cyanosis or edema  Neurologic:   CNII-XII intact, normal strength,  nonfocal     Weight change:   Intake/Output Summary (Last 24 hours) at 06/14/11 0753 Last data filed at 06/14/11 0600  Gross per 24 hour  Intake    300 ml  Output    601 ml  Net   -301 ml    Lab Results:   Basename 06/14/11 0255 06/13/11 1015  NA 130* 132*  K 4.0 4.3  CL 99 96  CO2 20 23  GLUCOSE 117* 144*  BUN 21 21  CREATININE 1.37* 1.20  CALCIUM 8.0* 9.1    Basename 06/14/11 0255 06/13/11 1930  WBC 17.6* 21.7*  HGB 11.4* 12.5*  HCT 32.5* 34.8*  PLT 155 185  MCV 91.8 89.9   PT/INR No results found for this basename: LABPROT:2,INR:2 in the last 72 hours ABG No results found for this basename: PHART:2,PCO2:2,PO2:2,HCO3:2 in the last 72  hours  Micro Results: Recent Results (from the past 240 hour(s))  MRSA PCR SCREENING     Status: Normal   Collection Time   06/13/11  5:26 PM      Component Value Range Status Comment   MRSA by PCR NEGATIVE  NEGATIVE  Final    Studies/Results: Dg Chest 2 View  06/13/2011  *RADIOLOGY REPORT*  Clinical Data: Chest pain.  Short of breath.  CHEST - 2 VIEW  Comparison: None.  Findings: Consolidation is present in the posterior segment of the left upper lobe.  Lungs are hyperaerated.  Bronchitic changes are noted. Mild patchy airspace disease in the inferior and lateral right upper lobe.  No pneumothorax.  The heart size.  IMPRESSION: Mild right upper lobe airspace disease.  Consolidation in the posterior segment of the left upper lobe.  Follow-up studies are recommended after appropriate antibiotic therapy.  If the abnormalities fail to resolve, CT should be performed to rule out underlying mass.  Original Report Authenticated By: Donavan Burnet, M.D.   Ct Angio Chest W/cm &/or Wo Cm  06/13/2011  *RADIOLOGY REPORT*  Clinical Data: 46 year old male with chest pain, shortness of breath and elevated D-dimer.  CT ANGIOGRAPHY CHEST  Technique:  Multidetector CT imaging of the  chest using the standard protocol during bolus administration of intravenous contrast. Multiplanar reconstructed images including MIPs were obtained and reviewed to evaluate the vascular anatomy.  Contrast: OMNIPAQUE IOHEXOL 350 MG/ML IV SOLN  Comparison: 06/13/2011 radiograph  Findings: This is a technically satisfactory study.  No pulmonary emboli are identified. There is no evidence of thoracic aortic aneurysm or dissection. The heart and great vessels are within normal limits.  A trace left pleural effusion is noted. There is no evidence of right pleural effusion or pericardial effusion. No enlarged lymph nodes are identified.  Consolidation within the posterior left upper lobe and lingula is compatible with pneumonia. Minimal  tree in bud opacities within the right lower lobe and right middle lobe (image 14) are compatible with minimal infection.  No suspicious nodules, masses or endobronchial/endotracheal lesions identified.  No acute or suspicious bony abnormalities are identified.  IMPRESSION: Left upper lobe/lingular consolidation compatible with pneumonia.  Minimal infection within the right middle lobe/right lower lobe.  No evidence of pulmonary emboli or thoracic aortic aneurysm/dissection.  No evidence of suspicious pulmonary nodule or mass.  Original Report Authenticated By: Rosendo Gros, M.D.   Medications: Scheduled Meds:   . albuterol  2.5 mg Nebulization Q6H  . azithromycin  500 mg Intravenous Q24H  . azithromycin  500 mg Oral Once  . cefTRIAXone (ROCEPHIN)  IV  1 g Intravenous Once  . cefTRIAXone (ROCEPHIN)  IV  1 g Intravenous Q24H  .  HYDROmorphone (DILAUDID) injection  1 mg Intravenous Once  . ibuprofen  400 mg Oral TID  . influenza  inactive virus vaccine  0.5 mL Intramuscular Tomorrow-1000  . ipratropium  0.5 mg Nebulization Q6H  . metoprolol tartrate  50 mg Oral BID  .  morphine injection  4 mg Intravenous Once  .  morphine injection  4 mg Intravenous Once  . ondansetron      . ondansetron  4 mg Intravenous Once  . ondansetron  4 mg Intravenous Once  . pantoprazole (PROTONIX) IV  40 mg Intravenous Q24H  . pneumococcal 23 valent vaccine  0.5 mL Intramuscular Tomorrow-1000  . sodium chloride  1,000 mL Intravenous Once  . sodium chloride  500 mL Intravenous Once  . sodium chloride  500 mL Intravenous Once   Continuous Infusions:   . sodium chloride 125 mL/hr at 06/14/11 0600   PRN Meds:.acetaminophen, iohexol, morphine injection, ondansetron (ZOFRAN) IV Assessment/Plan: Patient Active Hospital Problem List:  #1 Bil. upper lobe pneumonia  - continue current abx pending cultures, pt still febrile though leukocytosis improving - will obtain flu panel given above history #2Sepsis  syndrome - pt hypotensive with fevers, tachy and leukocytosis -2/2 to #1 vs hypovolemia -continue abx, bolus with NS, obtain lactic and procalcitonin levels and follow #3 Tobacco abuse - smoking cessation consult #5 tachycardia  - see #2, follow with management as above #6Hyponatremia - vol. Depletion vs SIADH 2/2 to #1, follow with hydration, and further eval if not improving.   LOS: 1 day   Yusef Lamp C 06/14/2011, 7:53 AM

## 2011-06-14 NOTE — Consult Note (Signed)
Pt is currently not up for tobacco counseling. Will visit later.

## 2011-06-15 ENCOUNTER — Inpatient Hospital Stay (HOSPITAL_COMMUNITY): Payer: Self-pay

## 2011-06-15 DIAGNOSIS — A419 Sepsis, unspecified organism: Secondary | ICD-10-CM

## 2011-06-15 DIAGNOSIS — J13 Pneumonia due to Streptococcus pneumoniae: Secondary | ICD-10-CM

## 2011-06-15 LAB — CBC
MCH: 31.8 pg (ref 26.0–34.0)
MCV: 90.9 fL (ref 78.0–100.0)
Platelets: 177 10*3/uL (ref 150–400)
RBC: 3.4 MIL/uL — ABNORMAL LOW (ref 4.22–5.81)
RDW: 14.6 % (ref 11.5–15.5)
WBC: 18.2 10*3/uL — ABNORMAL HIGH (ref 4.0–10.5)

## 2011-06-15 LAB — BASIC METABOLIC PANEL
CO2: 16 mEq/L — ABNORMAL LOW (ref 19–32)
Calcium: 7.9 mg/dL — ABNORMAL LOW (ref 8.4–10.5)
Chloride: 112 mEq/L (ref 96–112)
Creatinine, Ser: 1.86 mg/dL — ABNORMAL HIGH (ref 0.50–1.35)
GFR calc Af Amer: 49 mL/min — ABNORMAL LOW (ref >90)
Sodium: 137 mEq/L (ref 135–145)

## 2011-06-15 LAB — LEGIONELLA ANTIGEN, URINE: Legionella Antigen, Urine: NEGATIVE

## 2011-06-15 LAB — CLOSTRIDIUM DIFFICILE BY PCR: Toxigenic C. Difficile by PCR: NEGATIVE

## 2011-06-15 MED ORDER — NICOTINE 21 MG/24HR TD PT24
21.0000 mg | MEDICATED_PATCH | Freq: Every day | TRANSDERMAL | Status: DC
  Administered 2011-06-15 – 2011-06-17 (×2): 21 mg via TRANSDERMAL
  Filled 2011-06-15 (×3): qty 1

## 2011-06-15 MED ORDER — BIOTENE DRY MOUTH MT LIQD
15.0000 mL | Freq: Two times a day (BID) | OROMUCOSAL | Status: DC
  Administered 2011-06-16 – 2011-06-17 (×2): 15 mL via OROMUCOSAL

## 2011-06-15 NOTE — Progress Notes (Signed)
Subjective: States he feels better this am, but tired and appears dyspneic Objective: Vital signs in last 24 hours: Temp:  [97.6 F (36.4 Wilkins)-98.8 F (37.1 Wilkins)] 98.8 F (37.1 Wilkins) (02/26 1637) Pulse Rate:  [74-104] 104  (02/26 1637) Resp:  [19-34] 30  (02/26 1637) BP: (89-119)/(52-76) 119/71 mmHg (02/26 1637) SpO2:  [94 %-100 %] 99 % (02/26 1637) Last BM Date: 06/15/11 Intake/Output from previous day: 02/25 0701 - 02/26 0700 In: 6997 [P.O.:60; I.V.:3125; IV Piggyback:3812] Out: 1450 [Urine:1450] Intake/Output this shift:      General Appearance:    Alert, cooperative, no distress, appears stated age  Lungs:     Decreased BS at basess, no crackles , no wheezes respirations unlabored   Heart:    Regular rate and rhythm, S1 and S2 normal, no murmur, rub   or gallop  Abdomen:     Soft, non-tender, bowel sounds active all four quadrants,    no masses, no organomegaly  Extremities:   Extremities normal, atraumatic, no cyanosis or edema  Neurologic:   CNII-XII intact, normal strength,  nonfocal     Weight change:   Intake/Output Summary (Last 24 hours) at 06/15/11 2000 Last data filed at 06/15/11 1800  Gross per 24 hour  Intake   3057 ml  Output   1350 ml  Net   1707 ml    Lab Results:   Basename 06/15/11 0520 06/14/11 0255  NA 137 130*  K 3.6 4.0  CL 112 99  CO2 16* 20  GLUCOSE 91 117*  BUN 28* 21  CREATININE 1.86* 1.37*  CALCIUM 7.9* 8.0*    Basename 06/15/11 0520 06/14/11 0255  WBC 18.2* 17.6*  HGB 10.8* 11.4*  HCT 30.9* 32.5*  PLT 177 155  MCV 90.9 91.8   PT/INR No results found for this basename: LABPROT:2,INR:2 in the last 72 hours ABG No results found for this basename: PHART:2,PCO2:2,PO2:2,HCO3:2 in the last 72 hours  Micro Results: Recent Results (from the past 240 hour(s))  MRSA PCR SCREENING     Status: Normal   Collection Time   06/13/11  5:26 PM      Component Value Range Status Comment   MRSA by PCR NEGATIVE  NEGATIVE  Final   CULTURE,  BLOOD (ROUTINE X 2)     Status: Normal (Preliminary result)   Collection Time   06/13/11  8:21 PM      Component Value Range Status Comment   Specimen Description BLOOD LEFT HAND   Final    Special Requests BOTTLES DRAWN AEROBIC AND ANAEROBIC 10CC   Final    Culture  Setup Time [id number]   Final    Culture     Final    Value:        BLOOD CULTURE RECEIVED NO GROWTH TO DATE CULTURE WILL BE HELD FOR 5 DAYS BEFORE ISSUING A FINAL NEGATIVE REPORT   Report Status PENDING   Incomplete   CULTURE, BLOOD (ROUTINE X 2)     Status: Normal (Preliminary result)   Collection Time   06/13/11  8:34 PM      Component Value Range Status Comment   Specimen Description BLOOD LEFT ARM   Final    Special Requests BOTTLES DRAWN AEROBIC AND ANAEROBIC [facility]   Final    Culture  Setup Time [id number]   Final    Culture     Final    Value:        BLOOD CULTURE RECEIVED NO GROWTH TO DATE CULTURE WILL  BE HELD FOR 5 DAYS BEFORE ISSUING A FINAL NEGATIVE REPORT   Report Status PENDING   Incomplete   CLOSTRIDIUM DIFFICILE BY PCR     Status: Normal   Collection Time   06/15/11  1:17 AM      Component Value Range Status Comment   Wilkins difficile by pcr NEGATIVE  NEGATIVE  Final    Studies/Results: Dg Chest Port 1 View  06/14/2011  *RADIOLOGY REPORT*  Clinical Data: Pneumonia.  PORTABLE CHEST - 1 VIEW  Comparison: 06/13/2011.  Findings: Persistent left upper lobe pneumonia with slight increasing airspace consolidation mainly due to lower lung volumes. No pleural effusion.  No pneumothorax.  IMPRESSION: Persistent left upper lobe pneumonia.  Original Report Authenticated By: P. Loralie Champagne, M.D.   Medications: Scheduled Meds:    . albuterol  2.5 mg Nebulization Q6H  . azithromycin  500 mg Intravenous Q24H  . cefTRIAXone (ROCEPHIN)  IV  1 g Intravenous Q24H  . ipratropium  0.5 mg Nebulization Q6H  . nicotine  21 mg Transdermal Daily  . pantoprazole (PROTONIX) IV  40 mg Intravenous QHS  . pneumococcal 23 valent  vaccine  0.5 mL Intramuscular Tomorrow-1000  . sodium chloride  1,000 mL Intravenous Once  . DISCONTD: pantoprazole  40 mg Oral QHS   Continuous Infusions:    . sodium chloride 125 mL/hr at 06/15/11 1852   PRN Meds:.acetaminophen, HYDROcodone-acetaminophen, morphine injection, ondansetron (ZOFRAN) IV, promethazine Assessment/Plan: Patient Active Hospital Problem List:  #1CAP/Upper lobe pneumonia  - continue current abx, cultures neg todate, no further fevers overnight, though no significant change in leukocytosis - will monitor overnight again in step down-  check BNP in am  if dyspnea/tachypnea-improve then plan transfer to tele in am - appreciate ccm input, now signed off #2Sepsis -2/2 #1 - pt hypotensive with fevers, tachy and leukocytosis on 2/25  with elevated  procalcitonin levels -aggressiively fluid resuscitated, abx  and BP now better this am -as above cultures neg to date- follow #3 Tobacco abuse - smoking cessation consult #5 tachycardia  - multifactorial due to vol. Depletion and infection. -resolved #6Hyponatremia -2/2 vol. Depletion resolved with hydration  #7ARF - Likely 2/2 ATN from hypotension now resolved, and NSAIDS now dc/ed -follow and recheck  LOS: 2 days   Isaac Wilkins 06/15/2011, 8:00 PM

## 2011-06-15 NOTE — Progress Notes (Signed)
   CARE MANAGEMENT NOTE 06/15/2011  Patient:  Isaac Wilkins, Isaac Wilkins   Account Number:  [id number]  Date Initiated:  06/15/2011  Documentation initiated by:  Murdis Flitton  Subjective/Objective Assessment:   46 yr-old male adm with PNA/sepsis; living with his mother, independent PTA.     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  Was seen several yrs ago @ [facility] Dept and requests f/u appt with them.  Appt arranged for Monday, June 21, 2011, @ 1:15 p.m. With Dr. Ace Gins.

## 2011-06-15 NOTE — Consult Note (Signed)
Name: Isaac Wilkins MRN: [id number] DOB: November 09, 1965    LOS: 2  [facility] FU Note   History of Present Illness:  46 y/o WM with no PMH, current 1-2 ppk smoker admitted on 2/24 with a 3 day history of cough, left sided pleuritic chest pain, minimal sputum production, body aches/chills (did not check temp),nausea and decreased PO intake.  Day prior to admit, felt in usual state of health.  2/22 felt poorly but went out after work. Drank 2 beer & 2 mixed drinks -did not pass out, no vomiting.  Has had some diarrhea.  CXR & CTA of chest demonstrates Left UL PNA.  Noted to by hypotensive , with normal mental status on 2/25.  PCCM consulted for PNA/?sepsis.   Hypotension responded to fluids, lactate dropped  Lines / Drains:   Cultures: 2/24 BCx2>>>neg 2/24 MRSA PCR>>>neg 2/25 FLU PCR>>>neg 2/25 PCT>>>43.04 2/25 Lactic acid>>>2.0  Antibiotics: Rocephin 2/25 >>> Azithromycin 2/24>>>  Tests / Events: 2/24 CTA CHEST>>>Left upper lobe/lingular consolidation compatible with pneumonia. Minimal infection within the right middle lobe/right lower lobe. No evidence of pulmonary emboli or thoracic aortic aneurysm/dissection. No evidence of suspicious pulmonary nodule or mass.   Afebrile, c/o blood tinged sputum, chest pain, dyspneic on walking to [place]  Vital Signs: Filed Vitals:   06/15/11 0619 06/15/11 0700 06/15/11 0745 06/15/11 0800  BP: 100/60 102/64 107/74 109/71  Pulse: 74 77 93 86  Temp:    97.8 F (36.6 C)  TempSrc:   Oral Oral  Resp: 26 23 23 25   Height:      Weight:      SpO2: 99% 99% 96% 98%    I/O last 3 completed shifts: In: 8388.3 [P.O.:120; I.V.:4456.3; IV Piggyback:3812] Out: 2051 [Urine:2050; Stool:1]  Physical Examination: General: thin adult male in NAD Neuro: AAOx4, speech clear, MAE CV: s1s2 rrr, no m/r/g PULM: resp's even/non-labored, left lung diminished, right clear GI: round/soft, bsx4 active Extremities: warm/dry, no edema   Labs     CBC  Lab 06/15/11 0520 06/14/11 0255 06/13/11 1930  HGB 10.8* 11.4* 12.5*  HCT 30.9* 32.5* 34.8*  WBC 18.2* 17.6* 21.7*  PLT 177 155 185     BMET  Lab 06/15/11 0520 06/14/11 0255 06/13/11 1015  NA 137 130* 132*  K 3.6 4.0 --  CL 112 99 96  CO2 16* 20 23  GLUCOSE 91 117* 144*  BUN 28* 21 21  CREATININE 1.86* 1.37* 1.20  CALCIUM 7.9* 8.0* 9.1  MG -- 1.7 --  PHOS -- -- --     Radiology: 2/25 CXR>>>Left infiltrate   Assessment and Plan:  Left Upper PNA Assessment: Hx of smoker.  3 day hx of pleuritic chest pain, cough, mild sputum production, fever.   Left sided infiltrate noted on CTA & CXR.   Plan: -abx as above -smoking cessation - nicotine patch ok, chantix on dc -nebs not needed  here, no evidence of bronchospasm -d/c scheduled motrin given renal insufficiency --check urine legionella (given job hx) -Needs FU CXR to resolution, can FU with [place] on dc - appt made   Sepsis Assessment: secondary to PNA with mild renal insufficiency.   Likely dry with decreased PO intake and ETOH on 2/22 evening.   Plan: responded to fluids, lactate lower    Renal Insufficiency Assessment: secondary to sepsis and scheduled NSAID use Plan: -change NSAIDS to PRN -volume resuscitation    Best practices / Disposition: OK to transfer to med floor in my opinion, PCCM  to sign off -->Code Status:  -->DVT Px: -->GI Px: -->Diet:   Cyril Mourning MD. FCCP. [facility] Pager [phone] If no response call [phone]    06/15/2011, 9:40 AM

## 2011-06-16 ENCOUNTER — Inpatient Hospital Stay (HOSPITAL_COMMUNITY): Payer: Self-pay

## 2011-06-16 DIAGNOSIS — I509 Heart failure, unspecified: Secondary | ICD-10-CM

## 2011-06-16 DIAGNOSIS — A419 Sepsis, unspecified organism: Secondary | ICD-10-CM

## 2011-06-16 LAB — BASIC METABOLIC PANEL
BUN: 21 mg/dL (ref 6–23)
Chloride: 109 mEq/L (ref 96–112)
Chloride: 110 mEq/L (ref 96–112)
GFR calc Af Amer: 48 mL/min — ABNORMAL LOW (ref >90)
GFR calc Af Amer: 51 mL/min — ABNORMAL LOW (ref >90)
GFR calc non Af Amer: 41 mL/min — ABNORMAL LOW (ref >90)
GFR calc non Af Amer: 44 mL/min — ABNORMAL LOW (ref >90)
Potassium: 3.1 mEq/L — ABNORMAL LOW (ref 3.5–5.1)
Potassium: 3.3 mEq/L — ABNORMAL LOW (ref 3.5–5.1)
Sodium: 137 mEq/L (ref 135–145)
Sodium: 138 mEq/L (ref 135–145)

## 2011-06-16 LAB — URINALYSIS, ROUTINE W REFLEX MICROSCOPIC
Leukocytes, UA: NEGATIVE
Protein, ur: NEGATIVE mg/dL
Specific Gravity, Urine: 1.012 (ref 1.005–1.030)
Urobilinogen, UA: 0.2 mg/dL (ref 0.0–1.0)

## 2011-06-16 LAB — SODIUM, URINE, RANDOM: Sodium, Ur: 124 mEq/L

## 2011-06-16 LAB — CBC
HCT: 28.5 % — ABNORMAL LOW (ref 39.0–52.0)
MCHC: 35.8 g/dL (ref 30.0–36.0)
RDW: 14.3 % (ref 11.5–15.5)
WBC: 14.1 10*3/uL — ABNORMAL HIGH (ref 4.0–10.5)

## 2011-06-16 LAB — PRO B NATRIURETIC PEPTIDE: Pro B Natriuretic peptide (BNP): 5191 pg/mL — ABNORMAL HIGH (ref 0–125)

## 2011-06-16 MED ORDER — ALBUTEROL SULFATE (5 MG/ML) 0.5% IN NEBU
2.5000 mg | INHALATION_SOLUTION | RESPIRATORY_TRACT | Status: DC | PRN
  Administered 2011-06-16: 2.5 mg via RESPIRATORY_TRACT
  Filled 2011-06-16: qty 0.5

## 2011-06-16 MED ORDER — ALPRAZOLAM 0.5 MG PO TABS
0.5000 mg | ORAL_TABLET | Freq: Four times a day (QID) | ORAL | Status: DC | PRN
  Administered 2011-06-16: 0.5 mg via ORAL
  Filled 2011-06-16: qty 1

## 2011-06-16 MED ORDER — POTASSIUM CHLORIDE 10 MEQ/100ML IV SOLN
10.0000 meq | Freq: Once | INTRAVENOUS | Status: AC
  Administered 2011-06-16: 10 meq via INTRAVENOUS
  Filled 2011-06-16: qty 100

## 2011-06-16 MED ORDER — PANTOPRAZOLE SODIUM 40 MG PO TBEC
40.0000 mg | DELAYED_RELEASE_TABLET | Freq: Every day | ORAL | Status: DC
  Filled 2011-06-16: qty 1

## 2011-06-16 MED ORDER — POTASSIUM CHLORIDE 10 MEQ/100ML IV SOLN
10.0000 meq | INTRAVENOUS | Status: AC
  Administered 2011-06-16 (×3): 10 meq via INTRAVENOUS
  Filled 2011-06-16: qty 300

## 2011-06-16 MED ORDER — FUROSEMIDE 10 MG/ML IJ SOLN
20.0000 mg | Freq: Once | INTRAMUSCULAR | Status: DC
  Filled 2011-06-16: qty 2

## 2011-06-16 MED ORDER — HEPARIN SODIUM (PORCINE) 5000 UNIT/ML IJ SOLN
5000.0000 [IU] | Freq: Three times a day (TID) | INTRAMUSCULAR | Status: DC
  Administered 2011-06-16: 5000 [IU] via SUBCUTANEOUS
  Filled 2011-06-16 (×7): qty 1

## 2011-06-16 MED ORDER — DM-GUAIFENESIN ER 30-600 MG PO TB12
1.0000 | ORAL_TABLET | Freq: Two times a day (BID) | ORAL | Status: DC
  Administered 2011-06-16 – 2011-06-17 (×2): 1 via ORAL
  Filled 2011-06-16 (×4): qty 1

## 2011-06-16 NOTE — Progress Notes (Signed)
Subjective: Patient seen and examined, c/o cough with phlegm. BP is stable, also has been restless at night, but now feels better. CXR last night shows ? Fluid overload, he is positive 8 liters since he came to the hospital. He is currently on IV fluids at 125 ml/hr.  Objective: Vital signs in last 24 hours: Temp:  [97.8 F (36.6 C)-99 F (37.2 C)] 99 F (37.2 C) (02/27 0744) Pulse Rate:  [88-118] 118  (02/27 0744) Resp:  [22-34] 25  (02/27 0744) BP: (105-133)/(66-86) 129/81 mmHg (02/27 0744) SpO2:  [96 %-100 %] 96 % (02/27 0823) Weight change:  Last BM Date: 06/15/11  Intake/Output from previous day: 02/26 0701 - 02/27 0700 In: 3354 [P.O.:300; I.V.:2750; IV Piggyback:304] Out: 1750 [Urine:1750]     Physical Exam: HEENT: Atraumatic, normocephalic Neck: Supple Chest : Decreased BS bilaterally Heart : S1 S2 Regular, no murmurs Abdomen: Soft, Nontender, no organomegaly Ext : No cyanosis, clubbing, Trace edema   Lab Results: Basic Metabolic Panel:  Basename 06/16/11 0553 06/15/11 0520 06/14/11 0255  NA 137 137 --  K 3.1* 3.6 --  CL 109 112 --  CO2 16* 16* --  GLUCOSE 94 91 --  BUN 23 28* --  CREATININE 1.89* 1.86* --  CALCIUM 8.1* 7.9* --  MG -- -- 1.7  PHOS -- -- --   Liver Function Tests:  Basename 06/14/11 0255  AST 27  ALT 14  ALKPHOS 55  BILITOT 1.1  PROT 5.7*  ALBUMIN 2.3*   No results found for this basename: LIPASE:2,AMYLASE:2 in the last 72 hours No results found for this basename: AMMONIA:2 in the last 72 hours CBC:  Basename 06/16/11 0553 06/15/11 0520 06/14/11 0255  WBC 14.1* 18.2* --  NEUTROABS -- -- 16.0*  HGB 10.2* 10.8* --  HCT 28.5* 30.9* --  MCV 89.3 90.9 --  PLT 227 177 --   Cardiac Enzymes:  Basename 06/14/11 1144 06/14/11 0255 06/13/11 1904  CKTOTAL 63 69 66  CKMB 2.3 1.8 1.4  CKMBINDEX -- -- --  TROPONINI <0.30 <0.30 <0.30   BNP: No results found for this basename: PROBNP:3 in the last 72 hours D-Dimer:  Alvira Philips  06/13/11 1015  DDIMER 0.73*   Urine Drug Screen: Drugs of Abuse  No results found for this basename: labopia, cocainscrnur, labbenz, amphetmu, thcu, labbarb    Alcohol Level: No results found for this basename: ETH:2 in the last 72 hours Urinalysis: No results found for this basename: COLORURINE:2,APPERANCEUR:2,LABSPEC:2,PHURINE:2,GLUCOSEU:2,HGBUR:2,BILIRUBINUR:2,KETONESUR:2,PROTEINUR:2,UROBILINOGEN:2,NITRITE:2,LEUKOCYTESUR:2 in the last 72 hours Misc. Labs:  Recent Results (from the past 240 hour(s))  MRSA PCR SCREENING     Status: Normal   Collection Time   06/13/11  5:26 PM      Component Value Range Status Comment   MRSA by PCR NEGATIVE  NEGATIVE  Final   CULTURE, BLOOD (ROUTINE X 2)     Status: Normal (Preliminary result)   Collection Time   06/13/11  8:21 PM      Component Value Range Status Comment   Specimen Description BLOOD LEFT HAND   Final    Special Requests BOTTLES DRAWN AEROBIC AND ANAEROBIC 10CC   Final    Culture  Setup Time [id number]   Final    Culture     Final    Value:        BLOOD CULTURE RECEIVED NO GROWTH TO DATE CULTURE WILL BE HELD FOR 5 DAYS BEFORE ISSUING A FINAL NEGATIVE REPORT   Report Status PENDING   Incomplete   CULTURE, BLOOD (ROUTINE  X 2)     Status: Normal (Preliminary result)   Collection Time   06/13/11  8:34 PM      Component Value Range Status Comment   Specimen Description BLOOD LEFT ARM   Final    Special Requests BOTTLES DRAWN AEROBIC AND ANAEROBIC [facility]   Final    Culture  Setup Time [id number]   Final    Culture     Final    Value:        BLOOD CULTURE RECEIVED NO GROWTH TO DATE CULTURE WILL BE HELD FOR 5 DAYS BEFORE ISSUING A FINAL NEGATIVE REPORT   Report Status PENDING   Incomplete   CLOSTRIDIUM DIFFICILE BY PCR     Status: Normal   Collection Time   06/15/11  1:17 AM      Component Value Range Status Comment   C difficile by pcr NEGATIVE  NEGATIVE  Final     Studies/Results: Dg Chest Port 1 View  06/15/2011   *RADIOLOGY REPORT*  Clinical Data: Evaluate pneumonia  PORTABLE CHEST - 1 VIEW  Comparison: 06/14/2011; 06/13/2011; chest CT - 06/13/2011  Findings:  Grossly unchanged cardiac silhouette and mediastinal contours. Interval progression of heterogeneous / consolidative air space opacities now involving the majority of the left hemithorax.  There is now blunting of the left costophrenic angle suggestive of a small left-sided pleural effusion.  Interval increase in apparent opacities within the right lung base.  No definite pneumothorax. Grossly unchanged bones.  IMPRESSION: 1.  Interval progression of heterogeneous / consolidative opacities now involving the majority of the left hemithorax worrisome for progression of infection.  2.  Worsening right basilar heterogeneous opacities, possibly atelectasis or multifocal infection not excluded. Continued attention on follow-up is recommended.  Original Report Authenticated By: Waynard Reeds, M.D.   Dg Chest Port 1 View  06/14/2011  *RADIOLOGY REPORT*  Clinical Data: Pneumonia.  PORTABLE CHEST - 1 VIEW  Comparison: 06/13/2011.  Findings: Persistent left upper lobe pneumonia with slight increasing airspace consolidation mainly due to lower lung volumes. No pleural effusion.  No pneumothorax.  IMPRESSION: Persistent left upper lobe pneumonia.  Original Report Authenticated By: P. Loralie Champagne, M.D.    Medications: Scheduled Meds:   . albuterol  2.5 mg Nebulization Q6H  . antiseptic oral rinse  15 mL Mouth Rinse BID  . azithromycin  500 mg Intravenous Q24H  . cefTRIAXone (ROCEPHIN)  IV  1 g Intravenous Q24H  . dextromethorphan-guaiFENesin  1 tablet Oral BID  . furosemide  20 mg Intravenous Once  . ipratropium  0.5 mg Nebulization Q6H  . nicotine  21 mg Transdermal Daily  . pantoprazole (PROTONIX) IV  40 mg Intravenous QHS  . pneumococcal 23 valent vaccine  0.5 mL Intramuscular Tomorrow-1000   Continuous Infusions:   . sodium chloride 125 mL/hr at  06/16/11 0600   PRN Meds:.acetaminophen, albuterol, ALPRAZolam, HYDROcodone-acetaminophen, morphine injection, ondansetron (ZOFRAN) IV, promethazine  Assessment/Plan: Pneumonia Will continue on Zithromax and Rocephin. Blood cultures are negative so far. WBC is improving. Chest x-ray done last night showed worsening of the infiltrates.   Questionable pulmonary edema Chest x-ray shows worsening of the infiltrates, which could be due to the fluid overload. He will need Lasix but because of the renal insufficiency will obtain nephrology consultation. We'll obtain BNP and 2-D echo  Renal sufficiency acute versus chronic Patient's creatinine was 1.86 on 26th and today's 1.89, despite getting IV fluids at162mL per hour. Patient may have developed contrast induced nephropathy secondary to CT  angio with contrast done on 2/24, or he may have underlying chronic kidney disease. He also could have developed ATN due to sepsis. We'll obtain nephrology consultation to help with management with the fluid, renal function.  Hypokalemia Is the patient's potassium.  Cough We'll start Mucinex and dextromethorphan.  DVT prophylaxis We'll start heparin for DVT prophylaxis   LOS: 3 days   [facility] Pager: [phone] 06/16/2011, 8:41 AM

## 2011-06-16 NOTE — Consult Note (Signed)
Reason for Consult:AKI Referring Physician: Seward Coran is an 46 y.o. male.  HPI: Pt is a 45yo WM without sig PMH except for tobacco abuse who presented to [facility] with 1 day h/o F/C/cough prod of pink sputum, and worsening SOB.  Pt found to have lobar PNA and was admitted for IV Abx and O2.  He did receive a CT angio to r/o PE and we were asked to see the patient due to an acute rise in his creatinine post CT.  Pt denies any personal h/o kidney disease but his father was on HD for 5 years before his death in Oct 14, 2006.  Trend in Creatinine: Creatinine, Ser  Date/Time Value Range Status  06/16/2011  2:16 PM 1.78* 0.50-1.35 (mg/dL) Final  1/61/0960  4:54 AM 1.89* 0.50-1.35 (mg/dL) Final  0/98/1191  4:78 AM 1.86* 0.50-1.35 (mg/dL) Final  2/95/6213  0:86 AM 1.37* 0.50-1.35 (mg/dL) Final  5/78/4696 29:52 AM 1.20  0.50-1.35 (mg/dL) Final    PMH:  History reviewed. No pertinent past medical history.  PSH:   Past Surgical History  Procedure Date  . Steel pin put in rt hand 10/14/1983    Allergies:  Allergies  Allergen Reactions  . Bee Anaphylaxis    Medications:   Prior to Admission medications   Not on File    Discontinued Meds:   Medications Discontinued During This Encounter  Medication Reason  . Aspirin-Acetaminophen-Caffeine (GOODY HEADACHE PO) Completed Course  . pantoprazole (PROTONIX) injection 40 mg Change in therapy  . metoprolol (LOPRESSOR) tablet 50 mg   . influenza  inactive virus vaccine (FLUZONE/FLUARIX) injection 0.5 mL   . ibuprofen (ADVIL,MOTRIN) tablet 400 mg   . pantoprazole (PROTONIX) EC tablet 40 mg   . furosemide (LASIX) injection 20 mg   . pantoprazole (PROTONIX) injection 40 mg Change in therapy    Social History:  reports that he has been smoking.  He does not have any smokeless tobacco history on file. He reports that he drinks alcohol. He reports that he uses illicit drugs (Marijuana).  Family History:  History reviewed. No pertinent family  history.  A comprehensive review of systems was negative except for: Constitutional: positive for chills, fatigue, fevers and malaise Respiratory: positive for cough, dyspnea on exertion, hemoptysis and pleurisy/chest pain Cardiovascular: positive for chest pain and fatigue Gastrointestinal: positive for nausea  Blood pressure 129/81, pulse 118, temperature 99 F (37.2 C), temperature source Oral, resp. rate 25, height 6' (1.829 m), weight 60.6 kg (133 lb 9.6 oz), SpO2 96.00%. General appearance: alert, cooperative and mild distress Neck: no adenopathy, no carotid bruit, no JVD, supple, symmetrical, trachea midline and thyroid not enlarged, symmetric, no tenderness/mass/nodules Resp: diminished breath sounds LLL and egophony LLL Cardio: regular rate and rhythm, S1, S2 normal, no murmur, click, rub or gallop GI: soft, non-tender; bowel sounds normal; no masses,  no organomegaly Extremities: extremities normal, atraumatic, no cyanosis or edema  Labs: Basic Metabolic Panel:  Lab 06/16/11 8413 06/16/11 0553 06/15/11 0520 06/14/11 0255 06/13/11 1015  NA 138 137 137 130* 132*  K 3.3* 3.1* 3.6 4.0 4.3  CL 110 109 112 99 96  CO2 16* 16* 16* 20 23  GLUCOSE 99 94 91 117* 144*  BUN 21 23 28* 21 21  CREATININE 1.78* 1.89* 1.86* 1.37* 1.20  ALB -- -- -- -- --  CALCIUM 8.4 8.1* 7.9* 8.0* 9.1  PHOS -- -- -- -- --   Liver Function Tests:  Lab 06/14/11 0255  AST 27  ALT 14  ALKPHOS 55  BILITOT 1.1  PROT 5.7*  ALBUMIN 2.3*   No results found for this basename: LIPASE:3,AMYLASE:3 in the last 168 hours No results found for this basename: AMMONIA:3 in the last 168 hours CBC:  Lab 06/16/11 0553 06/15/11 0520 06/14/11 0255 06/13/11 1930  WBC 14.1* 18.2* 17.6* 21.7*  NEUTROABS -- -- 16.0* --  HGB 10.2* 10.8* 11.4* 12.5*  HCT 28.5* 30.9* 32.5* 34.8*  MCV 89.3 90.9 91.8 89.9  PLT 227 177 155 185   PT/INR: @labrcntip (inr:5) Cardiac Enzymes:  Lab 06/14/11 1144 06/14/11 0255 06/13/11 1904   CKTOTAL 63 69 66  CKMB 2.3 1.8 1.4  CKMBINDEX -- -- --  TROPONINI <0.30 <0.30 <0.30   CBG: No results found for this basename: GLUCAP:5 in the last 168 hours  Iron Studies: No results found for this basename: IRON:30,TIBC:30,TRANSFERRIN:30,FERRITIN:30 in the last 168 hours  Xrays/Other Studies: Dg Chest Port 1 View  06/15/2011  *RADIOLOGY REPORT*  Clinical Data: Evaluate pneumonia  PORTABLE CHEST - 1 VIEW  Comparison: 06/14/2011; 06/13/2011; chest CT - 06/13/2011  Findings:  Grossly unchanged cardiac silhouette and mediastinal contours. Interval progression of heterogeneous / consolidative air space opacities now involving the majority of the left hemithorax.  There is now blunting of the left costophrenic angle suggestive of a small left-sided pleural effusion.  Interval increase in apparent opacities within the right lung base.  No definite pneumothorax. Grossly unchanged bones.  IMPRESSION: 1.  Interval progression of heterogeneous / consolidative opacities now involving the majority of the left hemithorax worrisome for progression of infection.  2.  Worsening right basilar heterogeneous opacities, possibly atelectasis or multifocal infection not excluded. Continued attention on follow-up is recommended.  Original Report Authenticated By: Waynard Reeds, M.D.     Assessment/Plan: 1.  AKI- presumably due to Contrast-induced nephropathy in setting of acute illness and volume depletion +/- NAIDs.  Creatinine has already started to decline.  Will check [place] and UA and w/u based on those findings.   2. CAP- with early sepsis- improving with abx 3. Anemia- ?acute illness vs vasculitis.  If UA with hematuria, will check ANCA and antiGBM 4. Tobacco abuse- stress cessation   Hasheem Voland A 06/16/2011, 10:24 AM

## 2011-06-16 NOTE — Progress Notes (Signed)
Name: Isaac Wilkins MRN: [id number] DOB: 02-14-1966    LOS: 3  [facility] FU Note   History of Present Illness:  46 y/o WM with no PMH, current 1-2 ppk smoker admitted on 2/24 with a 3 day history of cough, left sided pleuritic chest pain, minimal sputum production, body aches/chills (did not check temp),nausea and decreased PO intake.  Day prior to admit, felt in usual state of health.  2/22 felt poorly but went out after work. Drank 2 beer & 2 mixed drinks -did not pass out, no vomiting.  Has had some diarrhea.  CXR & CTA of chest demonstrates Left UL PNA.  Noted to by hypotensive , with normal mental status on 2/25.  PCCM consulted for PNA/?sepsis.   Hypotension responded to fluids, lactate dropped  Lines / Drains:   Cultures: 2/24 BCx2>>>neg 2/24 MRSA PCR>>>neg 2/25 FLU PCR>>>neg 2/25 PCT>>>43.04 2/25 Lactic acid>>>2.0  Antibiotics: Rocephin 2/25 >>> Azithromycin 2/24>>>  Tests / Events: 2/24 CTA CHEST>>>Left upper lobe/lingular consolidation compatible with pneumonia. Minimal infection within the right middle lobe/right lower lobe. No evidence of pulmonary emboli or thoracic aortic aneurysm/dissection. No evidence of suspicious pulmonary nodule or mass.   Afebrile, c/o  Decrease in blood tinged sputum, chest pain, dyspneic on walking to BR  Vital Signs: Filed Vitals:   06/16/11 0414 06/16/11 0744 06/16/11 0823 06/16/11 1241  BP:  129/81  124/74  Pulse:  118  83  Temp:  99 F (37.2 C)  97.7 F (36.5 C)  TempSrc:  Oral  Oral  Resp:  25  30  Height:      Weight:      SpO2: 97% 96% 96% 93%    I/O last 3 completed shifts: In: 5176 [P.O.:360; I.V.:4500; IV Piggyback:316] Out: 2675 [Urine:2675]  Physical Examination: General: thin adult male in NAD Neuro: AAOx4, speech clear, MAE CV: s1s2 rrr, no m/r/g PULM: resp's even/non-labored, left lung diminished, crackles +, right clear GI: round/soft, bsx4 active Extremities: warm/dry, no  edema   Labs    CBC  Lab 06/16/11 0553 06/15/11 0520 06/14/11 0255  HGB 10.2* 10.8* 11.4*  HCT 28.5* 30.9* 32.5*  WBC 14.1* 18.2* 17.6*  PLT 227 177 155     BMET  Lab 06/16/11 0553 06/15/11 0520 06/14/11 0255 06/13/11 1015  NA 137 137 130* 132*  K 3.1* 3.6 -- --  CL 109 112 99 96  CO2 16* 16* 20 23  GLUCOSE 94 91 117* 144*  BUN 23 28* 21 21  CREATININE 1.89* 1.86* 1.37* 1.20  CALCIUM 8.1* 7.9* 8.0* 9.1  MG -- -- 1.7 --  PHOS -- -- -- --     Radiology: 2/26 CXR>>>blunting of left angle Decub >> minimal layering onleft, more on right   Assessment and Plan:  Left Upper PNA Assessment: Hx of smoker.  3 day hx of pleuritic chest pain, cough, mild sputum production, fever.   Left sided infiltrate noted on CTA & CXR.   Plan: -abx as above -smoking cessation - nicotine patch ok, chantix on dc -nebs not needed  here, no evidence of bronchospasm -d/c scheduled motrin given renal insufficiency -- urine legionella not sent? -Needs FU CXR to resolution, can FU with [place] on dc - appt made -Effusion too small to tap on side of pna, right effusion prob  Related to fluid boluses given for hypotension   Sepsis Assessment: secondary to PNA with mild renal insufficiency.   Likely dry with decreased PO intake and ETOH  on 2/22 evening.   Plan: responded to fluids, lactate lower    Renal Insufficiency Assessment: secondary to sepsis and scheduled NSAID use Plan: -change NSAIDS to PRN -volume resuscitation    Best practices / Disposition: OK to transfer to med floor in my opinion, PCCM to sign off -->Code Status:  -->DVT Px: -->GI Px: -->Diet:   Cyril Mourning MD. FCCP. [facility] Pager [phone] If no response call [phone]    06/16/2011, 3:12 PM   Isaac Wygant V.

## 2011-06-16 NOTE — Progress Notes (Signed)
Pt states and appears very agitated; states he is tired of coughing; pt is very restless; MD paged; PRN xanax given for agitation; will continue to monitor and update as needed

## 2011-06-16 NOTE — Progress Notes (Signed)
  Echocardiogram 2D Echocardiogram has been performed.  Isaac Wilkins [name] 06/16/2011, 10:50 AM

## 2011-06-17 LAB — URINALYSIS, ROUTINE W REFLEX MICROSCOPIC
Glucose, UA: NEGATIVE mg/dL
Ketones, ur: 15 mg/dL — AB
Leukocytes, UA: NEGATIVE
Nitrite: NEGATIVE
Protein, ur: NEGATIVE mg/dL
Urobilinogen, UA: 0.2 mg/dL (ref 0.0–1.0)

## 2011-06-17 LAB — RENAL FUNCTION PANEL
Albumin: 2 g/dL — ABNORMAL LOW (ref 3.5–5.2)
GFR calc Af Amer: 53 mL/min — ABNORMAL LOW (ref >90)
GFR calc non Af Amer: 45 mL/min — ABNORMAL LOW (ref >90)
Phosphorus: 3.1 mg/dL (ref 2.3–4.6)
Potassium: 3.2 mEq/L — ABNORMAL LOW (ref 3.5–5.1)
Sodium: 136 mEq/L (ref 135–145)

## 2011-06-17 LAB — LEGIONELLA ANTIGEN, URINE: Legionella Antigen, Urine: NEGATIVE

## 2011-06-17 MED ORDER — MOXIFLOXACIN HCL 400 MG PO TABS: 400.0000 mg | ORAL_TABLET | Freq: Every day | ORAL | Status: AC

## 2011-06-17 MED ORDER — LEVOFLOXACIN 500 MG PO TABS: 500.0000 mg | ORAL_TABLET | Freq: Every day | ORAL | Status: DC

## 2011-06-17 MED ORDER — POTASSIUM CHLORIDE CRYS ER 20 MEQ PO TBCR
40.0000 meq | EXTENDED_RELEASE_TABLET | Freq: Once | ORAL | Status: AC
  Administered 2011-06-17: 40 meq via ORAL
  Filled 2011-06-17: qty 2

## 2011-06-17 MED ORDER — DM-GUAIFENESIN ER 30-600 MG PO TB12: 1.0000 | ORAL_TABLET | Freq: Two times a day (BID) | ORAL | Status: AC

## 2011-06-17 MED ORDER — POTASSIUM CHLORIDE CRYS ER 20 MEQ PO TBCR: 20.0000 meq | EXTENDED_RELEASE_TABLET | Freq: Every day | ORAL | Status: DC

## 2011-06-17 MED ORDER — HYDROCODONE-ACETAMINOPHEN 5-325 MG PO TABS: 1.0000 | ORAL_TABLET | ORAL | Status: AC | PRN

## 2011-06-17 NOTE — Discharge Instructions (Addendum)
Appointment @ [facility] Department on Monday, June 21, 2011, @ 1:15 p.m. Telephone [phone]  Will need rest for five days, Can return to work on 06/22/11

## 2011-06-17 NOTE — Progress Notes (Signed)
   CARE MANAGEMENT NOTE 06/17/2011  Patient:  Isaac Wilkins, Isaac Wilkins   Account Number:  [id number]  Date Initiated:  06/15/2011  Documentation initiated by:  MAYO,HENRIETTA  Subjective/Objective Assessment:   46 yr-old male adm with PNA/sepsis; living with his mother, independent PTA.     Action/Plan:   Anticipated DC Date:  06/17/2011   Anticipated DC Plan:  HOME/SELF CARE  In-house referral  Financial Counselor      DC Planning Services  CM consult  Follow-up appt scheduled      Choice offered to / List presented to:             Status of service:   Medicare Important Message given?   (If response is "NO", the following Medicare IM given date fields will be blank) Date Medicare IM given:   Date Additional Medicare IM given:    Discharge Disposition:  HOME/SELF CARE  Per UR Regulation:    Comments:  2/28 spoke w pt and went over appt for [facility] for 06/21/11. this is on dc instruct sheet also. 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  Was seen several yrs ago @ [facility] Dept and requests f/u appt with them.  Appt arranged for Monday, June 21, 2011, @ 1:15 p.m.

## 2011-06-17 NOTE — Discharge Summary (Addendum)
Isaac Wilkins, 46 y.o., DOB 1965-11-01, MRN [id number]. Admission date: 06/13/2011 Discharge Date 06/17/2011 Primary MD No primary provider on file. Admitting Physician Kela Millin, MD  Admission Diagnosis  Leukocytosis [288.60] Pneumonia [486] Tachycardia [785.0] sob  Discharge Diagnosis Pneumonia Mild pulmonary edema Hypokalemia Acute kidney injury    Past Surgical History  Procedure Date  . Steel pin put in rt hand 1985     Brief History and Physical Patient is a 46 year old Caucasian male with history of chronic tobacco use but no known medical history presenting to the hospital with 3 days history of cough that is said to be productive of yellowish sputum. This is said to be associated with pleuritic chest pain mainly in the left hemithorax. Patient also complained about shortness of breath. He denied any history of palpitation. No PND or orthopnea. Associated with the cough and shortness of breath is fever which is said to be intermittent. Denies any history of chills or Rigors. Patient is said to be nauseated but denied any vomiting. He denies any history of abdominal discomfort. No diarrhea or hematochezia. No dysuria or hematuria.  Symptoms was said to have persisted pains patient presented to the hospital to be evaluated.  Hospital Course   Left upper pneumonia Patient was admitted with sepsis in the critical care service required fluid boluses and IV antibiotics. Patient's sepsis has resolved, he still has some pleural effusion. Patient will be sent home on Avelox 400 mg by mouth daily for 6 days. He follow up with [facility] consult March appointment has been made.  Renal insufficiency Patient developed acute kidney injury after he received contrast for CT angio, he also was receiving NSAIDs, which have been stopped. Patient's creatinine is improving he was seen by nephrology in the hospital. Nephrology has recommended the patient to stay in the hospital to the creatinine  was down, but he still wants to go home today. Patient has been given information to follow up with Dr. Abel Presto  as outpatient. On the day of discharge patient creatinine is 1.75.   Hypokalemia Patient will be given prescription for Kadian 20 mg by mouth daily for 10 days as his potassium is 3.2. Also his account to follow with his primary care service at [facility] health Department to check his BMP as outpatient next 10 days.  Mild pulmonary edema 2-D echo done the hospital showed EF in the range of 50- 55%, the edema is secondary to IV fluid overload from the fluid boluses patient received in the ICU. At this time his breathing has improved he still has mild pleural effusion we could not give Lasix because of the renal insufficiency. At this time patient wants to go home and will follow up with his PCP as well as pulmonary as outpatient   Consults  Pulmonary Nephrology  Significant Tests:  See full reports for all details    Dg Chest 2 View  06/13/2011    IMPRESSION: Mild right upper lobe airspace disease.  Consolidation in the posterior segment of the left upper lobe.  Follow-up studies are recommended after appropriate antibiotic therapy.  If the abnormalities fail to resolve, CT should be performed to rule out underlying mass.  Original Report Authenticated By: Donavan Burnet, M.D.   Ct Angio Chest W/cm &/or Wo Cm  06/13/2011  .  IMPRESSION: Left upper lobe/lingular consolidation compatible with pneumonia.  Minimal infection within the right middle lobe/right lower lobe.  No evidence of pulmonary emboli or thoracic aortic aneurysm/dissection.  No evidence  of suspicious pulmonary nodule or mass.    Dg Chest Bilateral Decubitus  06/16/2011  IMPRESSION:  1.  Left upper lobe pneumonia with small associated layering left pleural effusion. 2.  Slightly larger layering right pleural effusion.  The right lung appears clear.  Original Report Authenticated By: Gerrianne Scale, M.D.   US  Renal  06/16/2011   IMPRESSION: Increased echotexture within the kidneys suggesting medical renal disease. No hydronephrosis.  Bilateral effusions.  Original Report Authenticated By: Cyndie Chime, M.D.   Dg Chest Port 1 View  06/15/2011    IMPRESSION: 1.  Interval progression of heterogeneous / consolidative opacities now involving the majority of the left hemithorax worrisome for progression of infection.  2.  Worsening right basilar heterogeneous opacities, possibly atelectasis or multifocal infection not excluded. Continued attention on follow-up is recommended.  Original Report Authenticated By: Waynard Reeds, M.D.   Dg Chest Port 1 View  06/14/2011  *  IMPRESSION: Persistent left upper lobe pneumonia.  Original Report Authenticated By: P. Loralie Champagne, M.D.     Today   Subjective:   Isaac Wilkins today has no headache,no chest abdominal pain, no shortness of breath. Patient wants to go home today, he was advised to stay for a few more days to follow the renal functions in the hospital and also with a potassium. But he says that he will can follow up as outpatient Objective:   Blood pressure 141/78, pulse 100, temperature 97 F (36.1 C), temperature source Oral, resp. rate 28, height 6' (1.829 m), weight 60.6 kg (133 lb 9.6 oz), SpO2 97.00%.  Intake/Output Summary (Last 24 hours) at 06/17/11 1141 Last data filed at 06/17/11 1100  Gross per 24 hour  Intake    592 ml  Output   1820 ml  Net  -1228 ml    Exam Awake Alert, Oriented *3, No new F.N deficits, Normal affect [place].AT,PERRAL Supple Neck,No JVD, No cervical lymphadenopathy appriciated.  Symmetrical Chest wall movement, Good air movement bilaterally, CTAB RRR,No Gallops,Rubs or new Murmurs, No Parasternal Heave +ve B.Sounds, Abd Soft, Non tender, No organomegaly appriciated, No rebound -guarding or rigidity. No Cyanosis, Clubbing or edema, No new Rash or bruise  Data Review     CBC w Diff: Lab Results  Component  Value Date   WBC 14.1* 06/16/2011   HGB 10.2* 06/16/2011   HCT 28.5* 06/16/2011   PLT 227 06/16/2011   LYMPHOPCT 6* 06/14/2011   MONOPCT 3 06/14/2011   EOSPCT 0 06/14/2011   BASOPCT 0 06/14/2011   CMP: Lab Results  Component Value Date   NA 136 06/17/2011   K 3.2* 06/17/2011   CL 108 06/17/2011   CO2 18* 06/17/2011   BUN 20 06/17/2011   CREATININE 1.75* 06/17/2011   PROT 5.7* 06/14/2011   ALBUMIN 2.0* 06/17/2011   BILITOT 1.1 06/14/2011   ALKPHOS 55 06/14/2011   AST 27 06/14/2011   ALT 14 06/14/2011  .  Micro Results Recent Results (from the past 240 hour(s))  MRSA PCR SCREENING     Status: Normal   Collection Time   06/13/11  5:26 PM      Component Value Range Status Comment   MRSA by PCR NEGATIVE  NEGATIVE  Final   CULTURE, BLOOD (ROUTINE X 2)     Status: Normal (Preliminary result)   Collection Time   06/13/11  8:21 PM      Component Value Range Status Comment   Specimen Description BLOOD LEFT HAND   Final  Special Requests BOTTLES DRAWN AEROBIC AND ANAEROBIC 10CC   Final    Culture  Setup Time [id number]   Final    Culture     Final    Value:        BLOOD CULTURE RECEIVED NO GROWTH TO DATE CULTURE WILL BE HELD FOR 5 DAYS BEFORE ISSUING A FINAL NEGATIVE REPORT   Report Status PENDING   Incomplete   CULTURE, BLOOD (ROUTINE X 2)     Status: Normal (Preliminary result)   Collection Time   06/13/11  8:34 PM      Component Value Range Status Comment   Specimen Description BLOOD LEFT ARM   Final    Special Requests BOTTLES DRAWN AEROBIC AND ANAEROBIC 1OCC EACH   Final    Culture  Setup Time [id number]   Final    Culture     Final    Value:        BLOOD CULTURE RECEIVED NO GROWTH TO DATE CULTURE WILL BE HELD FOR 5 DAYS BEFORE ISSUING A FINAL NEGATIVE REPORT   Report Status PENDING   Incomplete   CLOSTRIDIUM DIFFICILE BY PCR     Status: Normal   Collection Time   06/15/11  1:17 AM      Component Value Range Status Comment   C difficile by pcr NEGATIVE  NEGATIVE  Final       Discharge Instructions      Follow-up Information    Follow up with PARRETT,TAMMY, NP on 06/24/2011. (11pm)    Contact information:   [facility] [address] [place] [address] [phone]          Discharge Medications   Medication List  As of 06/17/2011 11:41 AM   START taking these medications         dextromethorphan-guaiFENesin 30-600 MG per 12 hr tablet   Commonly known as: MUCINEX DM   Take 1 tablet by mouth 2 (two) times daily.      HYDROcodone-acetaminophen 5-325 MG per tablet   Commonly known as: NORCO   Take 1 tablet by mouth every 4 (four) hours as needed.      Avelox 400mg  MG tablet   Commonly known as:   Take 1 tablet (400 mg total) by mouth daily.      potassium chloride SA 20 MEQ tablet   Commonly known as: K-DUR,KLOR-CON   Take 1 tablet (20 mEq total) by mouth daily.          Where to get your medications    These are the prescriptions that you need to pick up. We sent them to a specific pharmacy, so you will need to go there to get them.   [facility] [place] - [address]    [address] [place] [address]    Phone: [phone]        potassium chloride SA 20 MEQ tablet         You may get these medications from any pharmacy.         dextromethorphan-guaiFENesin 30-600 MG per 12 hr tablet   HYDROcodone-acetaminophen 5-325 MG per tablet   levofloxacin 500 MG tablet             Total Time in preparing paper work, data evaluation and todays exam - 35 minutes  Elzy Tomasello S M.D on 06/17/2011 at 11:41 AM  [facility] Office  [phone]

## 2011-06-17 NOTE — Progress Notes (Signed)
Pt discharge instructions complete, prescriptions given to patient, IV's removed, belongings returned to pt. Pt has a ride from the hospital present in room.

## 2011-06-17 NOTE — Progress Notes (Signed)
Patient ID: Isaac Wilkins, male   DOB: Sep 26, 1965, 46 y.o.   MRN: [id number] S:feels better and states that "i am going home today" O:BP 141/78  Pulse 100  Temp(Src) 97 F (36.1 C) (Oral)  Resp 28  Ht 6' (1.829 m)  Wt 60.6 kg (133 lb 9.6 oz)  BMI 18.12 kg/m2  SpO2 97%  Intake/Output Summary (Last 24 hours) at 06/17/11 1039 Last data filed at 06/17/11 0700  Gross per 24 hour  Intake    602 ml  Output   1950 ml  Net  -1348 ml   Weight change:  Gen:WD WN WM in NAD WUJ:WJXBJ Resp:crackles at left lower lung field YNW:GNFAOZ Ext:no edema   Lab 06/17/11 0518 06/16/11 1416 06/16/11 0553 06/15/11 0520 06/14/11 0255 06/13/11 1015  NA 136 138 137 137 130* 132*  K 3.2* 3.3* 3.1* 3.6 4.0 4.3  CL 108 110 109 112 99 96  CO2 18* 16* 16* 16* 20 23  GLUCOSE 87 99 94 91 117* 144*  BUN 20 21 23  28* 21 21  CREATININE 1.75* 1.78* 1.89* 1.86* 1.37* 1.20  ALB -- -- -- -- -- --  CALCIUM 8.5 8.4 8.1* 7.9* 8.0* 9.1  PHOS 3.1 -- -- -- -- --  AST -- -- -- -- 27 --  ALT -- -- -- -- 14 --   Liver Function Tests:  Lab 06/17/11 0518 06/14/11 0255  AST -- 27  ALT -- 14  ALKPHOS -- 55  BILITOT -- 1.1  PROT -- 5.7*  ALBUMIN 2.0* 2.3*   No results found for this basename: LIPASE:3,AMYLASE:3 in the last 168 hours No results found for this basename: AMMONIA:3 in the last 168 hours CBC:  Lab 06/16/11 0553 06/15/11 0520 06/14/11 0255 06/13/11 1930 06/13/11 1015  WBC 14.1* 18.2* 17.6* -- --  NEUTROABS -- -- 16.0* -- --  HGB 10.2* 10.8* 11.4* -- --  HCT 28.5* 30.9* 32.5* -- --  MCV 89.3 90.9 91.8 89.9 89.0  PLT 227 177 155 -- --   Cardiac Enzymes:  Lab 06/14/11 1144 06/14/11 0255 06/13/11 1904  CKTOTAL 63 69 66  CKMB 2.3 1.8 1.4  CKMBINDEX -- -- --  TROPONINI <0.30 <0.30 <0.30   CBG: No results found for this basename: GLUCAP:5 in the last 168 hours  Iron Studies: No results found for this basename: IRON,TIBC,TRANSFERRIN,FERRITIN in the last 72 hours Studies/Results: Dg Chest  Bilateral Decubitus  06/16/2011  *RADIOLOGY REPORT*  Clinical Data: Shortness of breath.  Evaluate pleural effusions.  CHEST - BILATERAL DECUBITUS VIEW  Comparison: Portable examinations 06/14/2011 and 06/15/2011.  Findings: Decubitus views demonstrate a small layering pleural effusion on the right.  The right lung appears clear.  There is persistent air space disease in the left upper lobe.  Only a small amount of layering pleural fluid is seen on the left.  Heart size and mediastinal contours are stable.  IMPRESSION:  1.  Left upper lobe pneumonia with small associated layering left pleural effusion. 2.  Slightly larger layering right pleural effusion.  The right lung appears clear.  Original Report Authenticated By: Gerrianne Scale, M.D.   US Renal  06/16/2011  *RADIOLOGY REPORT*  Clinical Data: Acute renal failure.  RENAL/URINARY TRACT ULTRASOUND COMPLETE  Comparison:  None.  Findings:  Right Kidney:  12.4 cm.  Increased echotexture.  No focal abnormality or hydronephrosis.  Left Kidney:  11.9 cm.  Increased echotexture.  No focal abnormality or hydronephrosis.  Bladder:  Normal appearance.  Incidentally noted are bilateral  pleural effusions.  IMPRESSION: Increased echotexture within the kidneys suggesting medical renal disease. No hydronephrosis.  Bilateral effusions.  Original Report Authenticated By: Cyndie Chime, M.D.   Dg Chest Port 1 View  06/15/2011  *RADIOLOGY REPORT*  Clinical Data: Evaluate pneumonia  PORTABLE CHEST - 1 VIEW  Comparison: 06/14/2011; 06/13/2011; chest CT - 06/13/2011  Findings:  Grossly unchanged cardiac silhouette and mediastinal contours. Interval progression of heterogeneous / consolidative air space opacities now involving the majority of the left hemithorax.  There is now blunting of the left costophrenic angle suggestive of a small left-sided pleural effusion.  Interval increase in apparent opacities within the right lung base.  No definite pneumothorax. Grossly  unchanged bones.  IMPRESSION: 1.  Interval progression of heterogeneous / consolidative opacities now involving the majority of the left hemithorax worrisome for progression of infection.  2.  Worsening right basilar heterogeneous opacities, possibly atelectasis or multifocal infection not excluded. Continued attention on follow-up is recommended.  Original Report Authenticated By: Waynard Reeds, M.D.      . albuterol  2.5 mg Nebulization Q6H  . antiseptic oral rinse  15 mL Mouth Rinse BID  . azithromycin  500 mg Intravenous Q24H  . cefTRIAXone (ROCEPHIN)  IV  1 g Intravenous Q24H  . dextromethorphan-guaiFENesin  1 tablet Oral BID  . heparin subcutaneous  5,000 Units Subcutaneous Q8H  . ipratropium  0.5 mg Nebulization Q6H  . nicotine  21 mg Transdermal Daily  . pantoprazole  40 mg Oral QHS  . potassium chloride  10 mEq Intravenous Q1 Hr x 3  . potassium chloride  10 mEq Intravenous Once    BMET    Component Value Date/Time   NA 136 06/17/2011 0518   K 3.2* 06/17/2011 0518   CL 108 06/17/2011 0518   CO2 18* 06/17/2011 0518   GLUCOSE 87 06/17/2011 0518   BUN 20 06/17/2011 0518   CREATININE 1.75* 06/17/2011 0518   CALCIUM 8.5 06/17/2011 0518   GFRNONAA 45* 06/17/2011 0518   GFRAA 53* 06/17/2011 0518   CBC    Component Value Date/Time   WBC 14.1* 06/16/2011 0553   RBC 3.19* 06/16/2011 0553   HGB 10.2* 06/16/2011 0553   HCT 28.5* 06/16/2011 0553   PLT 227 06/16/2011 0553   MCV 89.3 06/16/2011 0553   MCH 32.0 06/16/2011 0553   MCHC 35.8 06/16/2011 0553   RDW 14.3 06/16/2011 0553   LYMPHSABS 1.1 06/14/2011 0255   MONOABS 0.5 06/14/2011 0255   EOSABS 0.0 06/14/2011 0255   BASOSABS 0.0 06/14/2011 0255     Assessment/Plan: 1. AKI- presumably due to Contrast-induced nephropathy in setting of acute illness and volume depletion +/- NAIDs. Creatinine started to decline but has plateaud. [place] without hydro but has persistent microscopic hematuria.  Will check vasculitis panel.  2. CAP- with early  sepsis- improving with abx 3. Anemia- ?acute illness vs vasculitis. given UA with hematuria, will check ANCA and antiGBM 4. Tobacco abuse- stress cessation 5. dispo- pt wants to go home, will need f/u with pcp to follow renal function.  I did give him my card for followup depending upon if he leaves or not today.  Would like to see creatinine lower but he is adamant that he is going home today.  Will defer to primary svc    Choya Tornow A

## 2011-06-18 LAB — C3 COMPLEMENT: C3 Complement: 147 mg/dL (ref 90–180)

## 2011-06-18 LAB — C4 COMPLEMENT: Complement C4, Body Fluid: 40 mg/dL (ref 10–40)

## 2011-06-18 LAB — ANTISTREPTOLYSIN O TITER: ASO: 150 IU/mL (ref 0–408)

## 2011-06-18 LAB — ANA: Anti Nuclear Antibody(ANA): NEGATIVE

## 2011-06-20 LAB — CULTURE, BLOOD (ROUTINE X 2)
Culture  Setup Time: 201302250144
Culture: NO GROWTH

## 2011-06-25 ENCOUNTER — Inpatient Hospital Stay: Payer: Self-pay | Admitting: Adult Health

## 2011-06-28 ENCOUNTER — Encounter (HOSPITAL_BASED_OUTPATIENT_CLINIC_OR_DEPARTMENT_OTHER): Payer: Self-pay | Admitting: *Deleted

## 2011-06-28 ENCOUNTER — Emergency Department (INDEPENDENT_AMBULATORY_CARE_PROVIDER_SITE_OTHER): Payer: 59

## 2011-06-28 ENCOUNTER — Other Ambulatory Visit: Payer: Self-pay

## 2011-06-28 ENCOUNTER — Emergency Department (HOSPITAL_BASED_OUTPATIENT_CLINIC_OR_DEPARTMENT_OTHER)
Admission: EM | Admit: 2011-06-28 | Discharge: 2011-06-28 | Disposition: A | Discharge status: Home / Self Care | Payer: Self-pay | Attending: Emergency Medicine | Admitting: Emergency Medicine

## 2011-06-28 DIAGNOSIS — R0789 Other chest pain: Secondary | ICD-10-CM

## 2011-06-28 DIAGNOSIS — J189 Pneumonia, unspecified organism: Secondary | ICD-10-CM | POA: Insufficient documentation

## 2011-06-28 DIAGNOSIS — R071 Chest pain on breathing: Secondary | ICD-10-CM | POA: Insufficient documentation

## 2011-06-28 DIAGNOSIS — R079 Chest pain, unspecified: Secondary | ICD-10-CM

## 2011-06-28 DIAGNOSIS — Z87891 Personal history of nicotine dependence: Secondary | ICD-10-CM | POA: Insufficient documentation

## 2011-06-28 DIAGNOSIS — Z8701 Personal history of pneumonia (recurrent): Secondary | ICD-10-CM

## 2011-06-28 DIAGNOSIS — R918 Other nonspecific abnormal finding of lung field: Secondary | ICD-10-CM

## 2011-06-28 DIAGNOSIS — R05 Cough: Secondary | ICD-10-CM | POA: Insufficient documentation

## 2011-06-28 DIAGNOSIS — R0602 Shortness of breath: Secondary | ICD-10-CM | POA: Insufficient documentation

## 2011-06-28 DIAGNOSIS — R059 Cough, unspecified: Secondary | ICD-10-CM | POA: Insufficient documentation

## 2011-06-28 DIAGNOSIS — R911 Solitary pulmonary nodule: Secondary | ICD-10-CM

## 2011-06-28 HISTORY — DX: Pneumonia, unspecified organism: J18.9

## 2011-06-28 LAB — BASIC METABOLIC PANEL
BUN: 27 mg/dL — ABNORMAL HIGH (ref 6–23)
Chloride: 98 mEq/L (ref 96–112)
Creatinine, Ser: 1.7 mg/dL — ABNORMAL HIGH (ref 0.50–1.35)
GFR calc Af Amer: 54 mL/min — ABNORMAL LOW (ref >90)
Glucose, Bld: 76 mg/dL (ref 70–99)

## 2011-06-28 LAB — CBC
HCT: 36.7 % — ABNORMAL LOW (ref 39.0–52.0)
MCHC: 34.3 g/dL (ref 30.0–36.0)
MCV: 92 fL (ref 78.0–100.0)
RDW: 14.1 % (ref 11.5–15.5)

## 2011-06-28 MED ORDER — IOHEXOL 350 MG/ML SOLN
64.0000 mL | Freq: Once | INTRAVENOUS | Status: AC | PRN
  Administered 2011-06-28: 64 mL via INTRAVENOUS

## 2011-06-28 MED ORDER — SODIUM CHLORIDE 0.9 % IV BOLUS (SEPSIS)
250.0000 mL | Freq: Once | INTRAVENOUS | Status: AC
  Administered 2011-06-28: 13:00:00 via INTRAVENOUS

## 2011-06-28 MED ORDER — SODIUM CHLORIDE 0.9 % IV SOLN: INTRAVENOUS | Status: DC

## 2011-06-28 MED ORDER — HYDROCODONE-ACETAMINOPHEN 5-325 MG PO TABS
1.0000 | ORAL_TABLET | Freq: Four times a day (QID) | ORAL | Status: AC | PRN
Start: 2011-06-28 — End: 2011-07-08

## 2011-06-28 NOTE — Discharge Instructions (Signed)
Chest Wall Pain Chest wall pain is pain in or around the bones and muscles of your chest. It may take up to 6 weeks to get better. It may take longer if you must stay physically active in your work and activities.  CAUSES  Chest wall pain may happen on its own. However, it may be caused by:  A viral illness like the flu.   Injury.   Coughing.   Exercise.   Arthritis.   Fibromyalgia.   Shingles.  HOME CARE INSTRUCTIONS   Avoid overtiring physical activity. Try not to strain or perform activities that cause pain. This includes any activities using your chest or your abdominal and side muscles, especially if heavy weights are used.   Put ice on the sore area.   Put ice in a plastic bag.   Place a towel between your skin and the bag.   Leave the ice on for 15 to 20 minutes per hour while awake for the first 2 days.   Only take over-the-counter or prescription medicines for pain, discomfort, or fever as directed by your caregiver.  SEEK IMMEDIATE MEDICAL CARE IF:   Your pain increases, or you are very uncomfortable.   You have a fever.   Your chest pain becomes worse.   You have new, unexplained symptoms.   You have nausea or vomiting.   You feel sweaty or lightheaded.   You have a cough with phlegm (sputum), or you cough up blood.  MAKE SURE YOU:   Understand these instructions.   Will watch your condition.   Will get help right away if you are not doing well or get worse.  Document Released: 04/05/2005 Document Revised: 03/25/2011 Document Reviewed: 11/30/2010 [employer] Patient Information 2012 [employer]. 

## 2011-06-28 NOTE — ED Provider Notes (Signed)
History     CSN: [id number]  Arrival date & time 06/28/11  0930   First MD Initiated Contact with Patient 06/28/11 1010      Chief Complaint  Patient presents with  . Pleurisy    left lung s/o double pneumonia    (Consider location/radiation/quality/duration/timing/severity/associated sxs/prior treatment) Patient is a 46 y.o. male presenting with chest pain. The history is provided by the patient.  Chest Pain The chest pain began 1 - 2 weeks ago. Chest pain occurs constantly. The chest pain is worsening. The pain is associated with breathing. At its most intense, the pain is at 10/10. The severity of the pain is moderate. The quality of the pain is described as pleuritic and sharp. The pain does not radiate. Primary symptoms include shortness of breath and cough. Pertinent negatives for primary symptoms include no fever, no wheezing, no abdominal pain, no nausea and no vomiting. He tried nothing for the symptoms. Risk factors include no known risk factors.   Discharge from Stepdown unit for pneumonia bilat and new renal insuff. Still on Avelox. Pain is left sided pleuretic. Overall pneumonia is better but the pain is worse.   Past Medical History  Diagnosis Date  . Pneumonia     05/2011    Past Surgical History  Procedure Date  . Steel pin put in rt hand 1985    No family history on file.  History  Substance Use Topics  . Smoking status: Former Smoker -- 1.0 packs/day for 20 years    Quit date: 05/24/2011  . Smokeless tobacco: Not on file   Comment: smokes 1-2 ppd for 20 years  . Alcohol Use: Yes     rarely      Review of Systems  Constitutional: Negative for fever and chills.  HENT: Negative for neck pain and neck stiffness.   Eyes: Negative for visual disturbance.  Respiratory: Positive for cough and shortness of breath. Negative for chest tightness and wheezing.   Cardiovascular: Positive for chest pain. Negative for leg swelling.  Gastrointestinal: Negative for  nausea, vomiting and abdominal pain.  Genitourinary: Negative for dysuria.  Musculoskeletal: Negative for back pain.  Skin: Negative for rash.  Neurological: Negative for headaches.  Hematological: Does not bruise/bleed easily.    Allergies  Bee  Home Medications   Current Outpatient Rx  Name Route Sig Dispense Refill  . DM-GUAIFENESIN ER 30-600 MG PO TB12 Oral Take 1 tablet by mouth 2 (two) times daily. 10 tablet 0  . HYDROCODONE-ACETAMINOPHEN 5-325 MG PO TABS Oral Take 1 tablet by mouth every 4 (four) hours as needed. 15 tablet 0  . HYDROCODONE-ACETAMINOPHEN 5-325 MG PO TABS Oral Take 1-2 tablets by mouth every 6 (six) hours as needed for pain. 10 tablet 0  . MOXIFLOXACIN HCL 400 MG PO TABS Oral Take 1 tablet (400 mg total) by mouth daily. 6 tablet 0  . POTASSIUM CHLORIDE CRYS ER 20 MEQ PO TBCR Oral Take 1 tablet (20 mEq total) by mouth daily. 20 tablet 0    BP 115/67  Pulse 73  Temp(Src) 97.7 F (36.5 C) (Oral)  Resp 20  Ht 6' (1.829 m)  Wt 142 lb (64.411 kg)  BMI 19.26 kg/m2  SpO2 100%  Physical Exam  Nursing note and vitals reviewed. Constitutional: He is oriented to person, place, and time. He appears well-developed and well-nourished. No distress.  HENT:  Head: Normocephalic and atraumatic.  Mouth/Throat: Oropharynx is clear and moist.  Eyes: Conjunctivae and EOM are normal. Pupils are  equal, round, and reactive to light.  Neck: Normal range of motion. Neck supple.  Cardiovascular: Normal rate, regular rhythm, normal heart sounds and intact distal pulses.  Exam reveals no friction rub.   No murmur heard. Pulmonary/Chest: Effort normal and breath sounds normal. No respiratory distress. He has no wheezes. He has no rales. He exhibits no tenderness.  Abdominal: Soft. Bowel sounds are normal. There is no tenderness.  Musculoskeletal: Normal range of motion. He exhibits no edema.  Lymphadenopathy:    He has no cervical adenopathy.  Neurological: He is alert and  oriented to person, place, and time. No cranial nerve deficit. He exhibits normal muscle tone. Coordination normal.  Skin: Skin is warm. No rash noted.    ED Course  Procedures (including critical care time)  Labs Reviewed  CBC - Abnormal; Notable for the following:    RBC 3.99 (*)    Hemoglobin 12.6 (*)    HCT 36.7 (*)    All other components within normal limits  BASIC METABOLIC PANEL - Abnormal; Notable for the following:    BUN 27 (*)    Creatinine, Ser 1.70 (*)    GFR calc non Af Amer 47 (*)    GFR calc Af Amer 54 (*)    All other components within normal limits  D-DIMER, QUANTITATIVE - Abnormal; Notable for the following:    D-Dimer, Quant 2.83 (*)    All other components within normal limits   Dg Chest 2 View  06/28/2011  *RADIOLOGY REPORT*  Clinical Data: Left-sided chest pain.  CHEST - 2 VIEW  Comparison: Chest x-ray from 06/15/2011.  Chest CT from 06/13/2011.  Findings: The pleuroparenchymal opacity in the left upper lobe seen previously has improved in the interval, but not resolved.  Right lung is clear on today's study. Interstitial markings are diffusely coarsened with chronic features. The cardiopericardial silhouette is within normal limits for size.  There is some persistent fullness in the left hilar region.  IMPRESSION: Interval improvement without resolution of the left upper lobe pleural parenchymal opacity with fullness in the left hilar region.  Original Report Authenticated By: ERIC A. MANSELL, M.D.   Ct Angio Chest W/cm &/or Wo Cm  06/28/2011  *RADIOLOGY REPORT*  Clinical Data: Pleuritic left chest pain, recent pneumonia  CT ANGIOGRAPHY CHEST  Technique:  Multidetector CT imaging of the chest using the standard protocol during bolus administration of intravenous contrast. Multiplanar reconstructed images including MIPs were obtained and reviewed to evaluate the vascular anatomy.  Contrast: 80 ml Omnipaque-300 IV  Comparison: Chest radiographs dated 06/28/2011.  CT  chest dated 06/13/2011.  Findings: No evidence of pulmonary embolism.  Patchy lingular/left upper lobe opacity, decreased, compatible with improving pneumonia. Very mild nodularity in the lateral right upper lobe and superior segment right lower lobe, improved, likely infectious.  Mild centrilobular emphysematous changes.  Trace left pleural effusion.  No pneumothorax.  Visualized thyroid is unremarkable.  The heart is normal in size.  No pericardial effusion.  No suspicious mediastinal, hilar, or axillary lymphadenopathy.  Visualized upper abdomen is unremarkable.  IMPRESSION: No evidence of pulmonary embolism.  Patchy lingular/left upper lobe opacity, decreased, compatible with improving pneumonia.  Additional mild nodularity in the lateral right upper lobe and superior segment right lower lobe, improved, likely infectious.  Mild centrilobular emphysematous changes.  Original Report Authenticated By: Charline Bills, M.D.    Date: 06/28/2011  Rate: 85  Rhythm: normal sinus rhythm  QRS Axis: normal  Intervals: normal  ST/T Wave abnormalities: early repolarization  Conduction Disutrbances:none  Narrative Interpretation:   Old EKG Reviewed: changes noted Some change in ST segment elevation in the inferior leads and lateral leads compared to EKG from 06/13/2011 maybe consistent with some pericarditis-type symptoms we'll keep that in mind.  Results for orders placed during the hospital encounter of 06/28/11  CBC      Component Value Range   WBC 8.9  4.0 - 10.5 (K/uL)   RBC 3.99 (*) 4.22 - 5.81 (MIL/uL)   Hemoglobin 12.6 (*) 13.0 - 17.0 (g/dL)   HCT 16.1 (*) 09.6 - 52.0 (%)   MCV 92.0  78.0 - 100.0 (fL)   MCH 31.6  26.0 - 34.0 (pg)   MCHC 34.3  30.0 - 36.0 (g/dL)   RDW 04.5  40.9 - 81.1 (%)   Platelets    150 - 400 (K/uL)   Value: PLATELET CLUMPS NOTED ON SMEAR, COUNT APPEARS ADEQUATE  BASIC METABOLIC PANEL      Component Value Range   Sodium 137  135 - 145 (mEq/L)   Potassium 5.1  3.5 - 5.1  (mEq/L)   Chloride 98  96 - 112 (mEq/L)   CO2 28  19 - 32 (mEq/L)   Glucose, Bld 76  70 - 99 (mg/dL)   BUN 27 (*) 6 - 23 (mg/dL)   Creatinine, Ser 9.14 (*) 0.50 - 1.35 (mg/dL)   Calcium 78.2  8.4 - 10.5 (mg/dL)   GFR calc non Af Amer 47 (*) >90 (mL/min)   GFR calc Af Amer 54 (*) >90 (mL/min)  D-DIMER, QUANTITATIVE      Component Value Range   D-Dimer, Quant 2.83 (*) 0.00 - 0.48 (ug/mL-FEU)     1. Chest wall pain       MDM  Chest wall pain not pulmonary embolus, improving pneumonia, stable renal insufficiency. No pneumothorax. Suspect plueretic chest pain from pneumonia. Has followup with nephrology.         Shelda Jakes, MD 06/28/11 2041

## 2011-06-28 NOTE — ED Notes (Signed)
Patient states he has had left lung pain for the last 2 weeks since being diagnosed with double pneumonia.  States pain is not improving.  Pain is worse lying or bending over.

## 2013-12-23 IMAGING — CT CT ANGIO CHEST
2 of 6 series · 19 of 36 positions shown · IV contrast (APPLIED)
Comparison: Chest radiographs dated 06/28/2011.  CT chest dated
06/13/2011.

CLINICAL DATA: Pleuritic left chest pain, recent pneumonia

CT ANGIOGRAPHY CHEST
TECHNIQUE: Multidetector CT imaging of the chest using the
standard protocol during bolus administration of intravenous
contrast. Multiplanar reconstructed images including MIPs were
obtained and reviewed to evaluate the vascular anatomy.
Contrast: 80 ml Hmnipaque-FCC IV

[Series 5: pe 1.0 b25f · axial · 0.61mm/px · z∈[-61,+191]mm · 18 of 281 slices shown]
[im 15/281  lung]
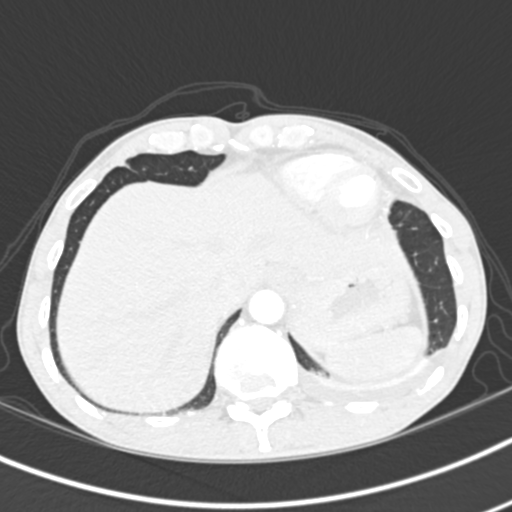
[im 29/281  mediastinal]
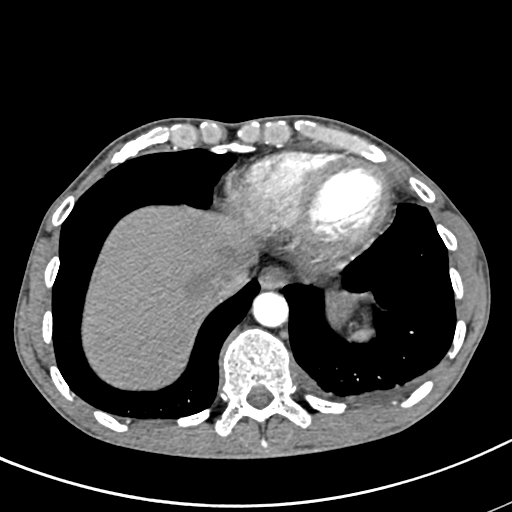
[im 43/281  lung]
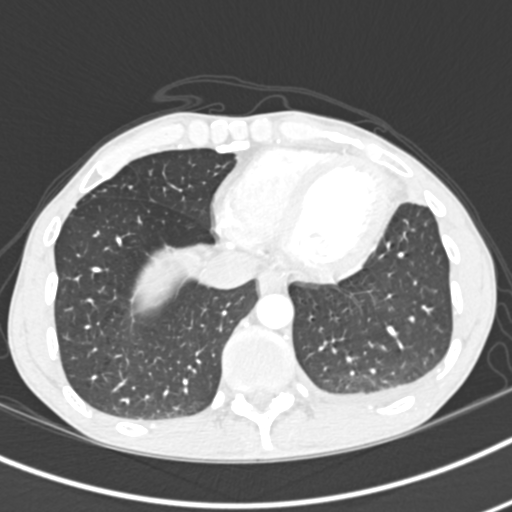
[im 57/281  mediastinal]
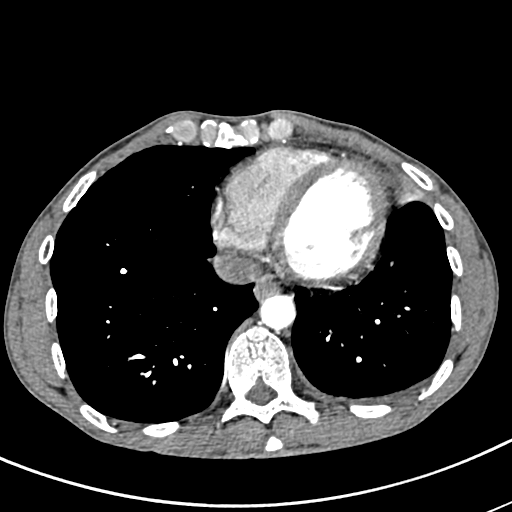
[im 71/281  lung]
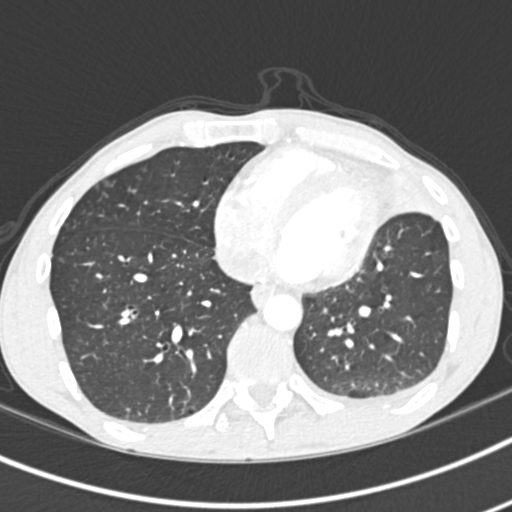
[im 85/281  mediastinal]
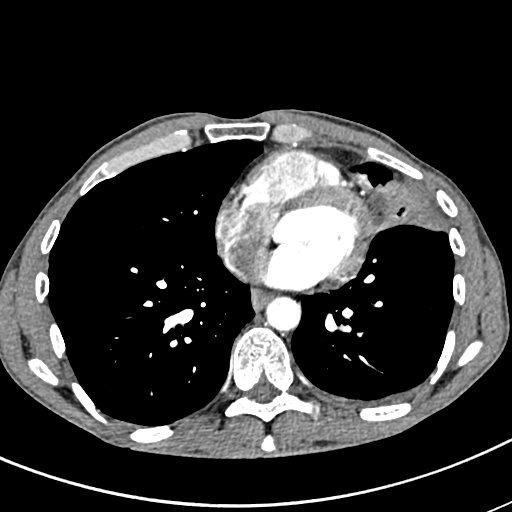
[im 99/281  lung]
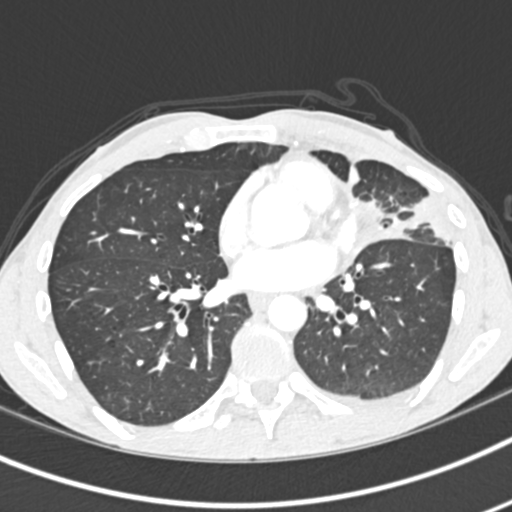
[im 113/281  mediastinal]
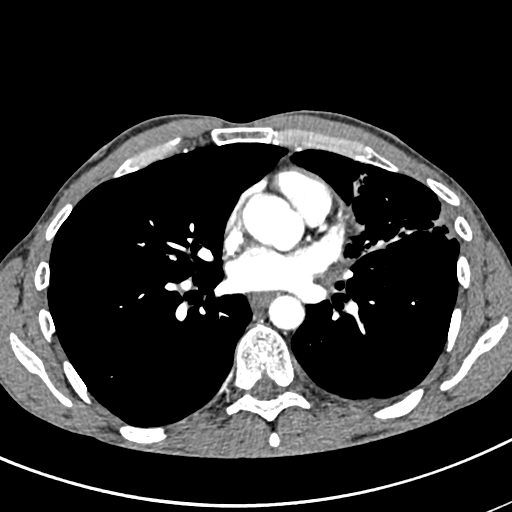
[im 127/281  lung]
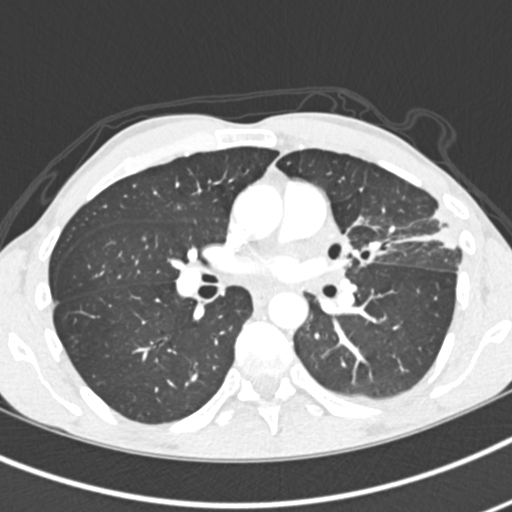
[im 155/281  mediastinal]
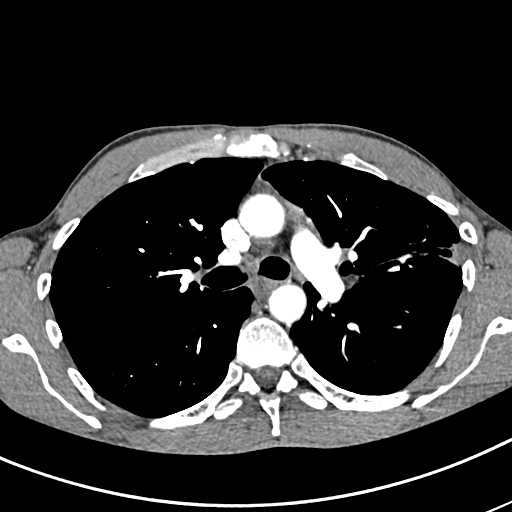
[im 169/281  lung]
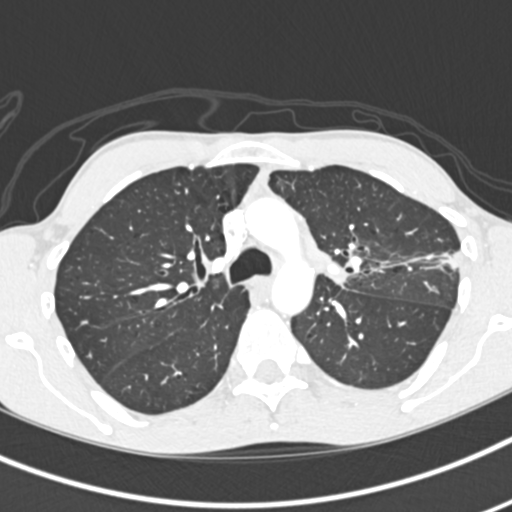
[im 183/281  mediastinal]
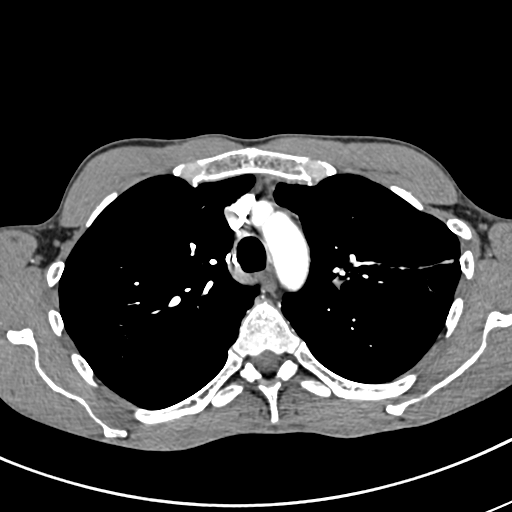
[im 197/281  lung]
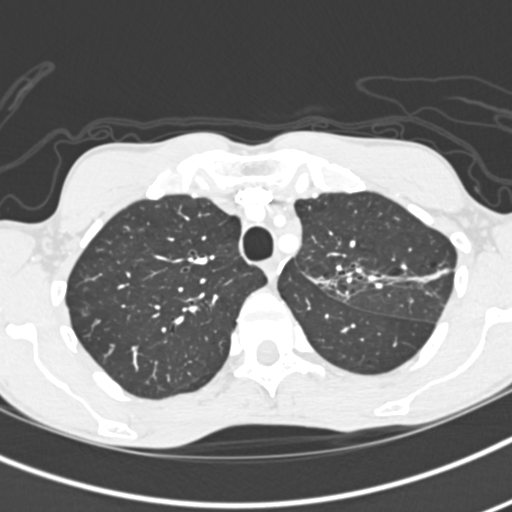
[im 211/281  mediastinal]
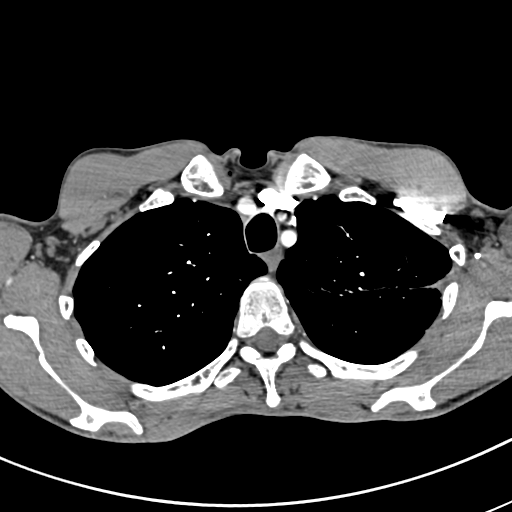
[im 225/281  lung]
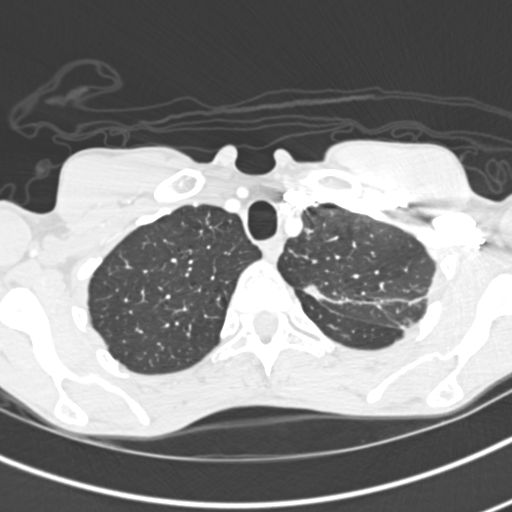
[im 239/281  mediastinal]
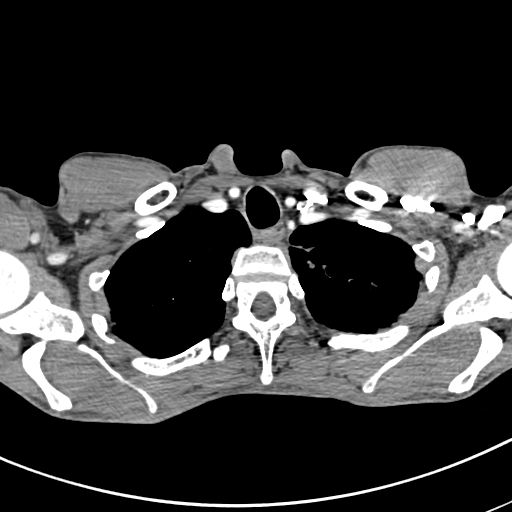
[im 253/281  lung]
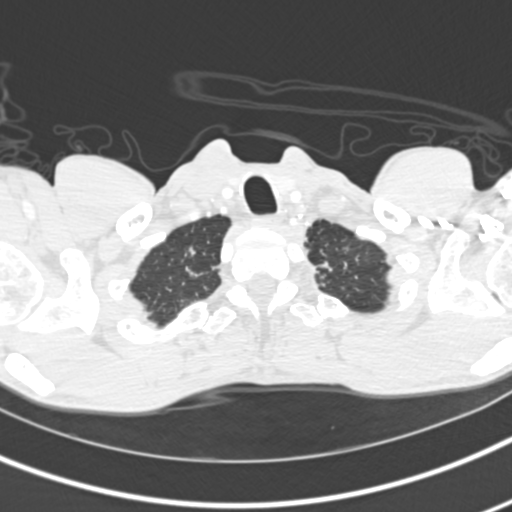
[im 267/281  mediastinal]
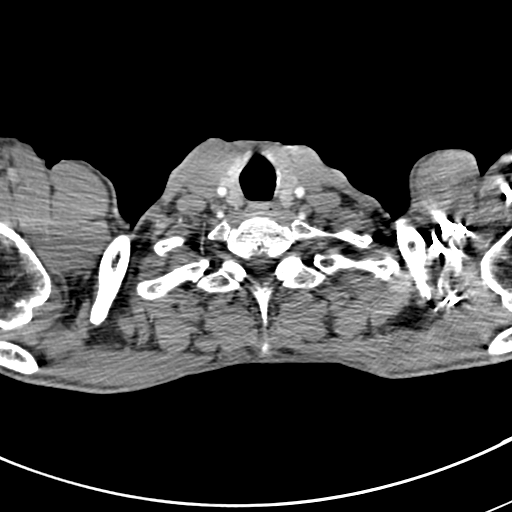

[Series 8: pe 2.0 coronal · coronal · 0.58mm/px · 1 of 107 slices shown]
[im 54/107  mediastinal]
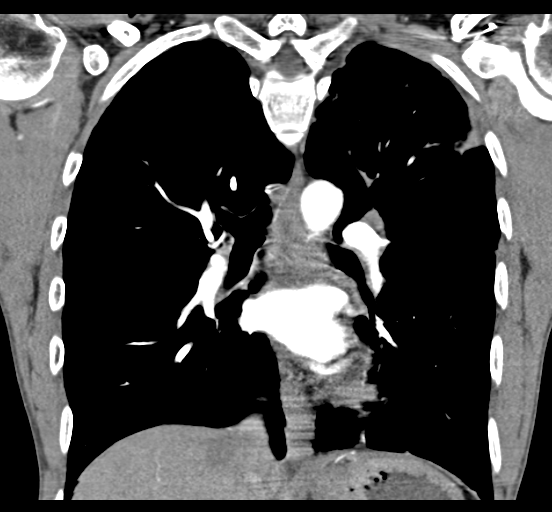

[19 of 36 positions shown; findings below may reference images not displayed]

FINDINGS: No evidence of pulmonary embolism.

Patchy lingular/left upper lobe opacity, decreased, compatible with
improving pneumonia. Very mild nodularity in the lateral right
upper lobe and superior segment right lower lobe, improved, likely
infectious.

Mild centrilobular emphysematous changes.  Trace left pleural
effusion.  No pneumothorax.

Visualized thyroid is unremarkable.

The heart is normal in size.  No pericardial effusion.

No suspicious mediastinal, hilar, or axillary lymphadenopathy.

Visualized upper abdomen is unremarkable.
IMPRESSION: No evidence of pulmonary embolism.

Patchy lingular/left upper lobe opacity, decreased, compatible with
improving pneumonia.

Additional mild nodularity in the lateral right upper lobe and
superior segment right lower lobe, improved, likely infectious.

Mild centrilobular emphysematous changes.

## 2016-08-30 ENCOUNTER — Emergency Department (HOSPITAL_BASED_OUTPATIENT_CLINIC_OR_DEPARTMENT_OTHER)
Admission: EM | Admit: 2016-08-30 | Discharge: 2016-08-30 | Disposition: A | Discharge status: Home / Self Care | Payer: 59 | Attending: Emergency Medicine | Admitting: Emergency Medicine

## 2016-08-30 ENCOUNTER — Encounter (HOSPITAL_BASED_OUTPATIENT_CLINIC_OR_DEPARTMENT_OTHER): Payer: Self-pay | Admitting: *Deleted

## 2016-08-30 ENCOUNTER — Emergency Department (HOSPITAL_BASED_OUTPATIENT_CLINIC_OR_DEPARTMENT_OTHER): Payer: 59

## 2016-08-30 DIAGNOSIS — Y939 Activity, unspecified: Secondary | ICD-10-CM | POA: Insufficient documentation

## 2016-08-30 DIAGNOSIS — Y999 Unspecified external cause status: Secondary | ICD-10-CM | POA: Insufficient documentation

## 2016-08-30 DIAGNOSIS — W230XXA Caught, crushed, jammed, or pinched between moving objects, initial encounter: Secondary | ICD-10-CM | POA: Insufficient documentation

## 2016-08-30 DIAGNOSIS — S62521A Displaced fracture of distal phalanx of right thumb, initial encounter for closed fracture: Secondary | ICD-10-CM | POA: Insufficient documentation

## 2016-08-30 DIAGNOSIS — F1290 Cannabis use, unspecified, uncomplicated: Secondary | ICD-10-CM | POA: Insufficient documentation

## 2016-08-30 DIAGNOSIS — Z87891 Personal history of nicotine dependence: Secondary | ICD-10-CM | POA: Insufficient documentation

## 2016-08-30 DIAGNOSIS — Y929 Unspecified place or not applicable: Secondary | ICD-10-CM | POA: Insufficient documentation

## 2016-08-30 DIAGNOSIS — T1490XA Injury, unspecified, initial encounter: Secondary | ICD-10-CM

## 2016-08-30 NOTE — ED Provider Notes (Signed)
[facility] Provider Note   CSN: [id number] Arrival date & time: 08/30/16  [id number]     History   Chief Complaint Chief Complaint  Patient presents with  . Finger Injury    HPI Isaac Wilkins is a 51 y.o. male who presents with chief complaint sudden onset, progressively improving right thumb pain after injury for 1 month. Patient states that one month ago he was working and crushed his right thumb between a jack and his dump truck. Initially he had lacerations to the palmar aspect of his right thumb which bled, but states they have healed. He developed immediate onset of pain and swelling. He states pain is currently intermittent, sharp and severe, worse with movement and palpation, and usually at rest he does not experience pain. He states that swelling has improved over the course of this month.  Tylenol has been initially helpful, but does not seem to be very helpful now. Initially he also soaked the thumb in Epsom salts and use a little bit of ice. He is right hand dominant and is self-employed as a Loss adjuster, charteredmanual laborer.   Denies numbness, tingling, weaknessalls, head trauma, LOC, CP, SOB, abd pain, back or neck pain.   The history is provided by the patient.    Past Medical History:  Diagnosis Date  . Pneumonia    05/2011    There are no active problems to display for this patient.   Past Surgical History:  Procedure Laterality Date  . steel pin put in rt hand  1985       Home Medications    Prior to Admission medications   Not on File    Family History History reviewed. No pertinent family history.  Social History Social History  Substance Use Topics  . Smoking status: Former Smoker    Packs/day: 1.00    Years: 20.00    Quit date: 05/24/2011  . Smokeless tobacco: Never Used     Comment: smokes 1-2 ppd for 20 years  . Alcohol use Yes     Comment: rarely     Allergies   Nutritional supplements   Review of Systems Review of Systems    Respiratory: Negative for shortness of breath.   Cardiovascular: Negative for chest pain.  Gastrointestinal: Negative for abdominal pain.  Musculoskeletal: Positive for arthralgias and joint swelling. Negative for back pain and neck pain.  Neurological: Negative for syncope, weakness and numbness.     Physical Exam Updated Vital Signs BP (!) 135/95 (BP Location: Left Arm)   Pulse 80   Temp 98 F (36.7 C) (Oral)   Resp 18   Ht 6' (1.829 m)   Wt 62.6 kg   SpO2 100%   BMI 18.72 kg/m   Physical Exam  Constitutional: He appears well-developed and well-nourished. No distress.  HENT:  Head: Normocephalic and atraumatic.  Eyes: Conjunctivae are normal.  Neck: Neck supple. No JVD present. No tracheal deviation present.  Cardiovascular: Normal rate and regular rhythm.   No murmur heard. Pulmonary/Chest: Effort normal and breath sounds normal. No respiratory distress.  Abdominal: Soft. There is no tenderness.  Musculoskeletal: He exhibits edema and tenderness.  Right thumb with swelling distally and tenderness to palpation distally on the palmar aspect. No disruption of the nail or the nailbed. Full range of motion of the MCP joint. Limited range of motion of the DIP. 5/5 strength on flexion and extension with some pain on extension. No snuffbox tenderness.  5/5 strength of right wrist.  Neurological: He is alert.  Fluent speech, no facial droop, altered sensation of palmar aspect of right thumb  Skin: Skin is warm and dry. Capillary refill takes less than 2 seconds. He is not diaphoretic.  2 semilunar 1cm well-healed lacerations to the right thumb, no bleeding or drainage  Psychiatric: He has a normal mood and affect. His behavior is normal.  Nursing note and vitals reviewed.    ED Treatments / Results  Labs (all labs ordered are listed, but only abnormal results are displayed) Labs Reviewed - No data to display  EKG  EKG Interpretation None       Radiology Dg Finger  Thumb Right  Result Date: 08/30/2016 CLINICAL DATA:  Crush injury of the right thumb 1 month ago. Persistent pain and swelling over the distal aspect of the thumb. EXAM: RIGHT THUMB 2+V COMPARISON:  None in PACs FINDINGS: There is a comminuted minimally distracted spiral fracture of the shaft of the distal phalanx. There is a small avulsion fracture fragment along the ventral aspect of the distal portion of the shaft. The fracture does not clearly involve the interphalangeal joint. The proximal phalanx is intact. There is no significant periosteal reaction. IMPRESSION: Subacute comminuted spiral minimally displaced fracture of the shaft of the distal phalanx of the right thumb. Definite bony bridging is not observed. Electronically Signed   By: David  [place] M.D.   On: 08/30/2016 09:18    Procedures Procedures (including critical care time)  Medications Ordered in ED Medications - No data to display   Initial Impression / Assessment and Plan / ED Course  I have reviewed the triage vital signs and the nursing notes.  Pertinent labs & imaging results that were available during my care of the patient were reviewed by me and considered in my medical decision making (see chart for details).     Patient with right thumb fracture for 1 month. X-ray shows no definite bony bridging. Neurovascularly intact, no disruption of nailbed. With ongoing pain and swelling, recommend follow-up with hand surgery outpatient for further evaluation. Applied thumb spica for comfort. Discussed use of ibuprofen and Tylenol as well as ice packs or heat for pain control. Discussed strict ED return precautions. Pt verbalized understanding of and agreement with plan and is safe for discharge home at this time.   Final Clinical Impressions(s) / ED Diagnoses   Final diagnoses:  Trauma  Closed displaced fracture of distal phalanx of right thumb, initial encounter    New Prescriptions There are no discharge medications  for this patient.    Jeanie Sewer, PA-C 08/30/16 [id number]    Geoffery Lyons, MD 09/06/16 [phone]

## 2016-08-30 NOTE — ED Triage Notes (Signed)
Pt reports crush injury to right thumb one month ago, crushed between a jack and frame of dump truck. Thumb remains swollen and painful with movement.

## 2016-08-30 NOTE — Discharge Instructions (Signed)
Alternate ibuprofen and Tylenol as needed for pain. Apply heat or ice to the area for comfort. Call hand surgery to make an appointment for evaluation as soon as you can. I have also attached information for [facility] and wellness and other primary care options that may be helpful to you. Return to the ED if any concerning symptoms develop.

## 2016-08-30 NOTE — ED Triage Notes (Signed)
Pt reports crush injury to right thumb one month ago, thumb crushed between a jack and frame of truck. Thumb is swollen, red, full rom with pain.

## 2019-02-25 IMAGING — CR DG FINGER THUMB 2+V*R*
3 series · 3 of 3 positions shown · non-contrast
Comparison: None in PACs

CLINICAL DATA: Crush injury of the right thumb 1 month ago.
Persistent pain and swelling over the distal aspect of the thumb.

EXAM:
RIGHT THUMB 2+V

[x finger pa right]
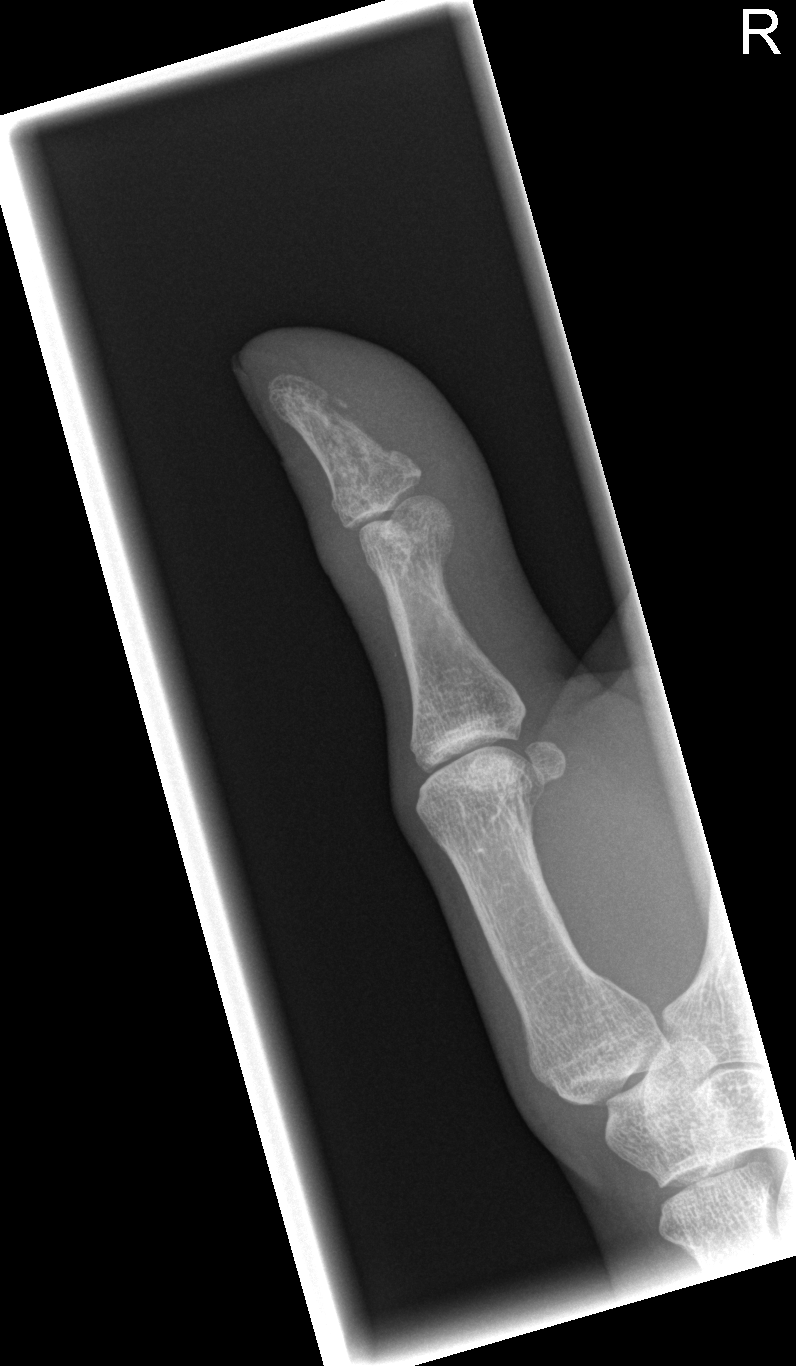

[x finger lateral right]
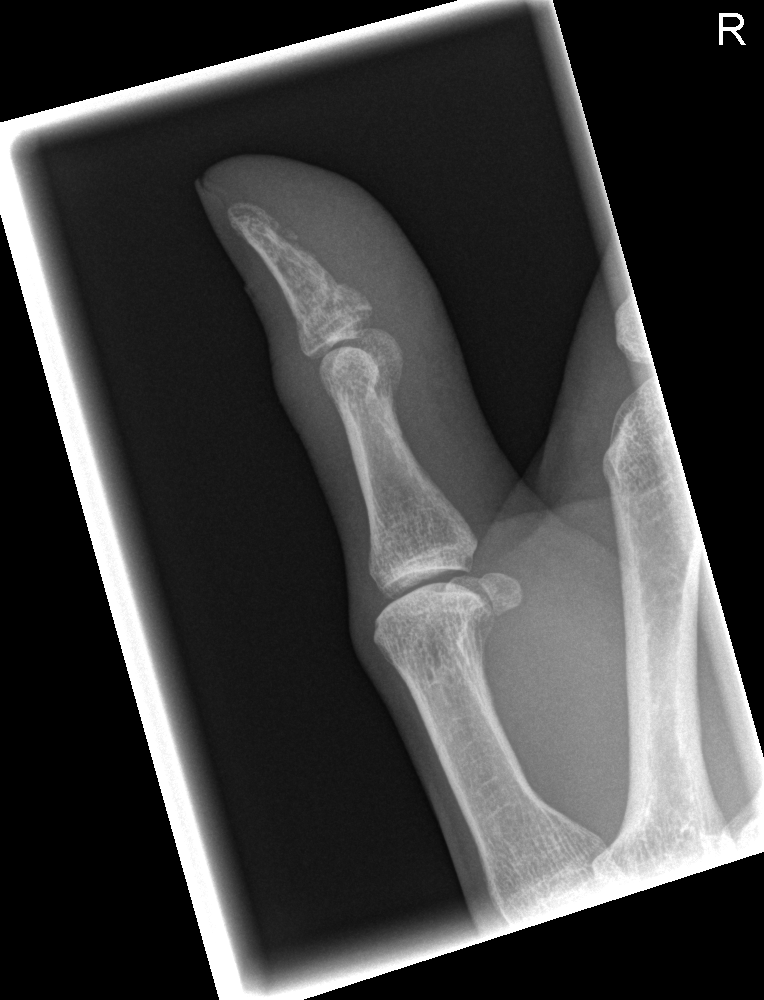

[x finger obl. right]
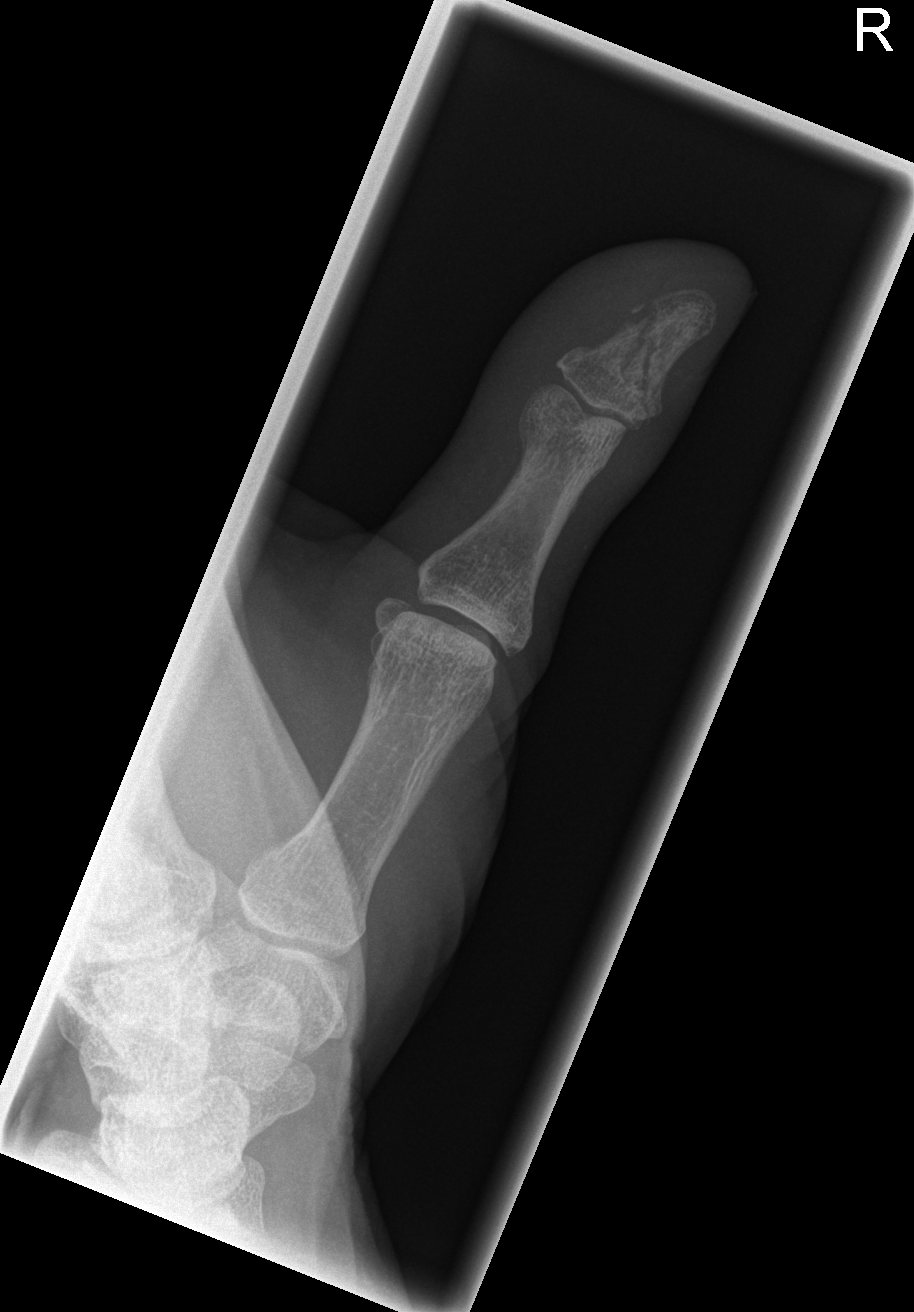

[3 of 3 positions shown; findings below may reference images not displayed]

FINDINGS: There is a comminuted minimally distracted spiral fracture of the
shaft of the distal phalanx. There is a small avulsion fracture
fragment along the ventral aspect of the distal portion of the
shaft. The fracture does not clearly involve the interphalangeal
joint. The proximal phalanx is intact. There is no significant
periosteal reaction.
IMPRESSION: Subacute comminuted spiral minimally displaced fracture of the shaft
of the distal phalanx of the right thumb. Definite bony bridging is
not observed.

## 2022-10-23 ENCOUNTER — Encounter (HOSPITAL_COMMUNITY): Payer: Self-pay | Admitting: *Deleted

## 2022-10-23 ENCOUNTER — Other Ambulatory Visit: Payer: Self-pay

## 2022-10-23 ENCOUNTER — Emergency Department (HOSPITAL_COMMUNITY)
Admission: EM | Admit: 2022-10-23 | Discharge: 2022-10-23 | Payer: 59 | Attending: Emergency Medicine | Admitting: Emergency Medicine

## 2022-10-23 DIAGNOSIS — S0512XA Contusion of eyeball and orbital tissues, left eye, initial encounter: Secondary | ICD-10-CM | POA: Insufficient documentation

## 2022-10-23 DIAGNOSIS — S80212A Abrasion, left knee, initial encounter: Secondary | ICD-10-CM | POA: Insufficient documentation

## 2022-10-23 DIAGNOSIS — S0990XA Unspecified injury of head, initial encounter: Secondary | ICD-10-CM

## 2022-10-23 DIAGNOSIS — F1990 Other psychoactive substance use, unspecified, uncomplicated: Secondary | ICD-10-CM

## 2022-10-23 DIAGNOSIS — Z5329 Procedure and treatment not carried out because of patient's decision for other reasons: Secondary | ICD-10-CM | POA: Insufficient documentation

## 2022-10-23 DIAGNOSIS — W228XXA Striking against or struck by other objects, initial encounter: Secondary | ICD-10-CM | POA: Insufficient documentation

## 2022-10-23 DIAGNOSIS — S0081XA Abrasion of other part of head, initial encounter: Secondary | ICD-10-CM | POA: Insufficient documentation

## 2022-10-23 MED ORDER — ONDANSETRON 4 MG PO TBDP
4.0000 mg | ORAL_TABLET | Freq: Once | ORAL | Status: AC
  Administered 2022-10-23: 4 mg via ORAL
  Filled 2022-10-23: qty 1

## 2022-10-23 NOTE — ED Triage Notes (Signed)
Pt BIB [facility], pt states he thought he took some cocaine but he thinks it was actually fentanyl. Pt states narcan was given.  Pt was vomiting at registration.  + HA   pt was abrasions to left knee and face due to passing out per pt. Pt denies SI.

## 2022-10-23 NOTE — ED Provider Notes (Signed)
[place] EMERGENCY DEPARTMENT AT [facility] Provider Note   CSN: [id number] Arrival date & time: 10/23/22  1327     History  Chief Complaint  Patient presents with   Medical Clearance    Isaac Wilkins is a 57 y.o. male.  HPI Patient brought in by police.  Reportedly was getting pulled over and did hit of what he thought was cocaine.  Had become combative and passed out.  Reportedly fell.  Hitting head.  Patient left knee with pain.  Also hit head.  Headache and has had nausea.  Hematoma below left eye.  States he will occasionally do a pain pill.  Does not feel as if he is withdrawing at this time.  Brought in for medical clearance.   Past Medical History:  Diagnosis Date   Pneumonia    05/2011    Home Medications Prior to Admission medications   Not on File      Allergies    Nutritional supplements    Review of Systems   Review of Systems  Physical Exam Updated Vital Signs BP 118/84 (BP Location: Left Arm)   Pulse 97   Temp (!) 96.4 F (35.8 C) (Temporal)   Resp 18   Ht 6' (1.829 m)   Wt 59 kg   SpO2 99%   BMI 17.63 kg/m  Physical Exam Vitals and nursing note reviewed.  HENT:     Head:     Comments: Periorbital hematoma below left eye.  Eye movements intact.  Abrasion to jaw with good opening closing. Cardiovascular:     Rate and Rhythm: Regular rhythm.  Chest:     Chest wall: No tenderness.  Abdominal:     Tenderness: There is no abdominal tenderness.  Musculoskeletal:        General: Tenderness present.     Cervical back: Neck supple. No tenderness.     Comments: Tenderness left knee with abrasion.  No large effusion.  Neurological:     Mental Status: He is alert and oriented to person, place, and time. Mental status is at baseline.     ED Results / Procedures / Treatments   Labs (all labs ordered are listed, but only abnormal results are displayed) Labs Reviewed - No data to display  EKG None  Radiology No results  found.  Procedures Procedures    Medications Ordered in ED Medications  ondansetron (ZOFRAN-ODT) disintegrating tablet 4 mg (4 mg Oral Given 10/23/22 1410)    ED Course/ Medical Decision Making/ A&P                             Medical Decision Making Risk Prescription drug management.   Patient with syncope after drug use.  Also potential physical injury.  Hematoma to left eye.  Left knee pain.  Is nauseous which would go along with head injury, however patient at this time is refusing further interventions.  Brought in under police custody but is voluntary.  Discussed later still refusing workup.  Patient however did consent to the police blood draw.  Patient is leaving against advice.  Instructed that he is free to return at any time.          Final Clinical Impression(s) / ED Diagnoses Final diagnoses:  Injury of head, initial encounter  Substance use    Rx / DC Orders ED Discharge Orders     None         Dalina Samara,  Harrold Donath, MD 10/23/22 [phone]

## 2022-10-23 NOTE — ED Notes (Signed)
Pt gave consent for police blood draw.

## 2022-10-23 NOTE — Discharge Instructions (Addendum)
You are leaving against our advice.  You have at least concussion if not a more severe head injury.  I cannot rule out injury such as intracranial hemorrhage.  Feel free to return for further workup.

## 2022-12-21 DIAGNOSIS — F1120 Opioid dependence, uncomplicated: Secondary | ICD-10-CM | POA: Diagnosis not present

## 2022-12-23 DIAGNOSIS — F1120 Opioid dependence, uncomplicated: Secondary | ICD-10-CM | POA: Diagnosis not present

## 2022-12-29 DIAGNOSIS — F1120 Opioid dependence, uncomplicated: Secondary | ICD-10-CM | POA: Diagnosis not present

## 2023-01-05 DIAGNOSIS — F1120 Opioid dependence, uncomplicated: Secondary | ICD-10-CM | POA: Diagnosis not present

## 2023-01-12 DIAGNOSIS — F1120 Opioid dependence, uncomplicated: Secondary | ICD-10-CM | POA: Diagnosis not present

## 2023-01-19 DIAGNOSIS — F1120 Opioid dependence, uncomplicated: Secondary | ICD-10-CM | POA: Diagnosis not present

## 2023-01-26 DIAGNOSIS — F1120 Opioid dependence, uncomplicated: Secondary | ICD-10-CM | POA: Diagnosis not present

## 2023-07-21 DIAGNOSIS — F1120 Opioid dependence, uncomplicated: Secondary | ICD-10-CM | POA: Diagnosis not present

## 2023-07-28 DIAGNOSIS — F1120 Opioid dependence, uncomplicated: Secondary | ICD-10-CM | POA: Diagnosis not present

## 2023-08-04 DIAGNOSIS — F1120 Opioid dependence, uncomplicated: Secondary | ICD-10-CM | POA: Diagnosis not present

## 2023-08-11 DIAGNOSIS — F1120 Opioid dependence, uncomplicated: Secondary | ICD-10-CM | POA: Diagnosis not present

## 2023-08-25 DIAGNOSIS — F1120 Opioid dependence, uncomplicated: Secondary | ICD-10-CM | POA: Diagnosis not present

## 2023-09-08 DIAGNOSIS — F1121 Opioid dependence, in remission: Secondary | ICD-10-CM | POA: Diagnosis not present

## 2023-09-15 DIAGNOSIS — F1121 Opioid dependence, in remission: Secondary | ICD-10-CM | POA: Diagnosis not present

## 2023-09-22 DIAGNOSIS — F1121 Opioid dependence, in remission: Secondary | ICD-10-CM | POA: Diagnosis not present

## 2023-10-06 DIAGNOSIS — F1120 Opioid dependence, uncomplicated: Secondary | ICD-10-CM | POA: Diagnosis not present

## 2023-10-06 DIAGNOSIS — F1121 Opioid dependence, in remission: Secondary | ICD-10-CM | POA: Diagnosis not present

## 2023-11-03 DIAGNOSIS — F1120 Opioid dependence, uncomplicated: Secondary | ICD-10-CM | POA: Diagnosis not present

## 2023-11-03 DIAGNOSIS — F1121 Opioid dependence, in remission: Secondary | ICD-10-CM | POA: Diagnosis not present

## 2024-01-18 DIAGNOSIS — R3911 Hesitancy of micturition: Secondary | ICD-10-CM | POA: Diagnosis not present

## 2024-01-25 DIAGNOSIS — F1121 Opioid dependence, in remission: Secondary | ICD-10-CM | POA: Diagnosis not present

## 2024-02-01 DIAGNOSIS — Z20822 Contact with and (suspected) exposure to covid-19: Secondary | ICD-10-CM | POA: Diagnosis not present

## 2024-02-01 DIAGNOSIS — J111 Influenza due to unidentified influenza virus with other respiratory manifestations: Secondary | ICD-10-CM | POA: Diagnosis not present

## 2024-02-01 DIAGNOSIS — R0981 Nasal congestion: Secondary | ICD-10-CM | POA: Diagnosis not present

## 2024-02-08 DIAGNOSIS — F1120 Opioid dependence, uncomplicated: Secondary | ICD-10-CM | POA: Diagnosis not present

## 2024-02-08 DIAGNOSIS — F1121 Opioid dependence, in remission: Secondary | ICD-10-CM | POA: Diagnosis not present

## 2024-02-16 DIAGNOSIS — N3001 Acute cystitis with hematuria: Secondary | ICD-10-CM | POA: Diagnosis not present

## 2024-02-16 DIAGNOSIS — B029 Zoster without complications: Secondary | ICD-10-CM | POA: Diagnosis not present

## 2024-02-16 DIAGNOSIS — R39198 Other difficulties with micturition: Secondary | ICD-10-CM | POA: Diagnosis not present

## 2024-02-16 DIAGNOSIS — R079 Chest pain, unspecified: Secondary | ICD-10-CM | POA: Diagnosis not present

## 2024-02-16 DIAGNOSIS — R319 Hematuria, unspecified: Secondary | ICD-10-CM | POA: Diagnosis not present

## 2024-02-22 DIAGNOSIS — R630 Anorexia: Secondary | ICD-10-CM | POA: Diagnosis not present

## 2024-02-22 DIAGNOSIS — F1121 Opioid dependence, in remission: Secondary | ICD-10-CM | POA: Diagnosis not present

## 2024-02-22 DIAGNOSIS — F1120 Opioid dependence, uncomplicated: Secondary | ICD-10-CM | POA: Diagnosis not present

## 2024-04-09 DIAGNOSIS — F1121 Opioid dependence, in remission: Secondary | ICD-10-CM | POA: Diagnosis not present
